# Patient Record
Sex: Female | Born: 1980 | Race: Black or African American | Hispanic: No | Marital: Single | Smoking: Current some day smoker
Health system: Southern US, Community
[De-identification: ages and names within clinical notes are randomized; demographics above are authoritative.]

## PROBLEM LIST (undated history)

## (undated) DIAGNOSIS — F419 Anxiety disorder, unspecified: Secondary | ICD-10-CM

## (undated) DIAGNOSIS — D649 Anemia, unspecified: Secondary | ICD-10-CM

## (undated) DIAGNOSIS — F4310 Post-traumatic stress disorder, unspecified: Secondary | ICD-10-CM

## (undated) HISTORY — PX: CARTILAGE SURGERY: SHX1303

## (undated) HISTORY — PX: LIGAMENT REPAIR: SHX5444

## (undated) HISTORY — PX: CHEST SURGERY: SHX595

---

## 2001-11-27 ENCOUNTER — Emergency Department (HOSPITAL_COMMUNITY): Admission: EM | Admit: 2001-11-27 | Discharge: 2001-11-27 | Payer: Self-pay

## 2004-12-15 ENCOUNTER — Inpatient Hospital Stay (HOSPITAL_COMMUNITY): Admission: AD | Admit: 2004-12-15 | Discharge: 2004-12-15 | Payer: Self-pay | Admitting: Obstetrics & Gynecology

## 2005-02-11 ENCOUNTER — Inpatient Hospital Stay: Admission: AD | Admit: 2005-02-11 | Discharge: 2005-02-11 | Payer: Self-pay | Admitting: Obstetrics and Gynecology

## 2008-02-13 ENCOUNTER — Emergency Department (HOSPITAL_COMMUNITY): Admission: EM | Admit: 2008-02-13 | Discharge: 2008-02-13 | Payer: Self-pay | Admitting: Emergency Medicine

## 2008-08-27 ENCOUNTER — Emergency Department (HOSPITAL_COMMUNITY): Admission: EM | Admit: 2008-08-27 | Discharge: 2008-08-27 | Payer: Self-pay | Admitting: Emergency Medicine

## 2009-06-25 ENCOUNTER — Emergency Department (HOSPITAL_COMMUNITY): Admission: EM | Admit: 2009-06-25 | Discharge: 2009-06-25 | Payer: Self-pay | Admitting: Emergency Medicine

## 2009-11-07 ENCOUNTER — Emergency Department (HOSPITAL_COMMUNITY): Admission: EM | Admit: 2009-11-07 | Discharge: 2009-11-07 | Payer: Self-pay | Admitting: Emergency Medicine

## 2010-07-17 ENCOUNTER — Emergency Department (HOSPITAL_COMMUNITY): Admission: EM | Admit: 2010-07-17 | Discharge: 2010-07-17 | Payer: Self-pay | Admitting: Emergency Medicine

## 2010-08-31 ENCOUNTER — Emergency Department (HOSPITAL_COMMUNITY): Admission: EM | Admit: 2010-08-31 | Discharge: 2010-08-31 | Payer: Self-pay | Admitting: Emergency Medicine

## 2011-02-05 ENCOUNTER — Emergency Department (HOSPITAL_COMMUNITY)
Admission: EM | Admit: 2011-02-05 | Discharge: 2011-02-05 | Disposition: A | Discharge status: Home / Self Care | Payer: Self-pay | Attending: Emergency Medicine | Admitting: Emergency Medicine

## 2011-02-05 DIAGNOSIS — L089 Local infection of the skin and subcutaneous tissue, unspecified: Secondary | ICD-10-CM | POA: Insufficient documentation

## 2011-09-09 LAB — URINE MICROSCOPIC-ADD ON

## 2011-09-09 LAB — URINALYSIS, ROUTINE W REFLEX MICROSCOPIC
Bilirubin Urine: NEGATIVE
Glucose, UA: NEGATIVE
Hgb urine dipstick: NEGATIVE
Urobilinogen, UA: 1

## 2013-03-07 ENCOUNTER — Emergency Department (HOSPITAL_COMMUNITY)
Admission: EM | Admit: 2013-03-07 | Discharge: 2013-03-07 | Disposition: A | Discharge status: Home / Self Care | Payer: Medicaid Other | Attending: Emergency Medicine | Admitting: Emergency Medicine

## 2013-03-07 ENCOUNTER — Encounter (HOSPITAL_COMMUNITY): Payer: Self-pay | Admitting: Emergency Medicine

## 2013-03-07 DIAGNOSIS — N938 Other specified abnormal uterine and vaginal bleeding: Secondary | ICD-10-CM | POA: Insufficient documentation

## 2013-03-07 DIAGNOSIS — R112 Nausea with vomiting, unspecified: Secondary | ICD-10-CM | POA: Insufficient documentation

## 2013-03-07 DIAGNOSIS — F17200 Nicotine dependence, unspecified, uncomplicated: Secondary | ICD-10-CM | POA: Insufficient documentation

## 2013-03-07 DIAGNOSIS — Z862 Personal history of diseases of the blood and blood-forming organs and certain disorders involving the immune mechanism: Secondary | ICD-10-CM | POA: Insufficient documentation

## 2013-03-07 DIAGNOSIS — R197 Diarrhea, unspecified: Secondary | ICD-10-CM | POA: Insufficient documentation

## 2013-03-07 DIAGNOSIS — Z3202 Encounter for pregnancy test, result negative: Secondary | ICD-10-CM | POA: Insufficient documentation

## 2013-03-07 DIAGNOSIS — N949 Unspecified condition associated with female genital organs and menstrual cycle: Secondary | ICD-10-CM | POA: Insufficient documentation

## 2013-03-07 HISTORY — DX: Anemia, unspecified: D64.9

## 2013-03-07 LAB — COMPREHENSIVE METABOLIC PANEL
ALT: 15 U/L (ref 0–35)
AST: 22 U/L (ref 0–37)
Albumin: 3.3 g/dL — ABNORMAL LOW (ref 3.5–5.2)
Alkaline Phosphatase: 51 U/L (ref 39–117)
BUN: 18 mg/dL (ref 6–23)
CO2: 25 mEq/L (ref 19–32)
Calcium: 9.1 mg/dL (ref 8.4–10.5)
Chloride: 105 mEq/L (ref 96–112)
Creatinine, Ser: 0.83 mg/dL (ref 0.50–1.10)
GFR calc Af Amer: >90 mL/min (ref >90)
GFR calc non Af Amer: >90 mL/min (ref >90)
Glucose, Bld: 96 mg/dL (ref 70–99)
Potassium: 3.7 mEq/L (ref 3.5–5.1)
Sodium: 139 mEq/L (ref 135–145)
Total Bilirubin: 0.1 mg/dL — ABNORMAL LOW (ref 0.3–1.2)
Total Protein: 6.9 g/dL (ref 6.0–8.3)

## 2013-03-07 LAB — CBC WITH DIFFERENTIAL/PLATELET
HCT: 32.1 % — ABNORMAL LOW (ref 36.0–46.0)
Hemoglobin: 11.2 g/dL — ABNORMAL LOW (ref 12.0–15.0)
Lymphs Abs: 2.2 10*3/uL (ref 0.7–4.0)
MCHC: 34.9 g/dL (ref 30.0–36.0)
Monocytes Absolute: 0.5 10*3/uL (ref 0.1–1.0)
Monocytes Relative: 7 % (ref 3–12)
Platelets: 199 10*3/uL (ref 150–400)

## 2013-03-07 LAB — URINALYSIS, MICROSCOPIC ONLY
Bilirubin Urine: NEGATIVE
Glucose, UA: NEGATIVE mg/dL
Ketones, ur: 15 mg/dL — AB
Leukocytes, UA: NEGATIVE
Nitrite: NEGATIVE
Protein, ur: NEGATIVE mg/dL
Specific Gravity, Urine: 1.03 (ref 1.005–1.030)
Urobilinogen, UA: 1 mg/dL (ref 0.0–1.0)
pH: 8 (ref 5.0–8.0)

## 2013-03-07 LAB — LIPASE, BLOOD: Lipase: 35 U/L (ref 11–59)

## 2013-03-07 MED ORDER — ONDANSETRON 4 MG PO TBDP
8.0000 mg | ORAL_TABLET | Freq: Once | ORAL | Status: AC
  Administered 2013-03-07: 8 mg via ORAL
  Filled 2013-03-07: qty 2

## 2013-03-07 MED ORDER — ONDANSETRON 8 MG PO TBDP: 8.0000 mg | ORAL_TABLET | Freq: Three times a day (TID) | ORAL | Status: DC | PRN

## 2013-03-07 MED ORDER — OXYCODONE-ACETAMINOPHEN 5-325 MG PO TABS: 1.0000 | ORAL_TABLET | ORAL | Status: DC | PRN

## 2013-03-07 MED ORDER — OXYCODONE-ACETAMINOPHEN 5-325 MG PO TABS
2.0000 | ORAL_TABLET | Freq: Once | ORAL | Status: AC
  Administered 2013-03-07: 2 via ORAL
  Filled 2013-03-07: qty 2

## 2013-03-07 MED ORDER — IBUPROFEN 600 MG PO TABS: 600.0000 mg | ORAL_TABLET | Freq: Three times a day (TID) | ORAL | Status: DC | PRN

## 2013-03-07 NOTE — ED Notes (Signed)
Pt knows we need a urine sample 

## 2013-03-07 NOTE — ED Notes (Signed)
Pt reports for the past 3 months she has been getting sick with the onset of her menstrual periods. Pt reports usually she deals with heavy bleeding and pain but the past three months she has had N/V/D that occurs 24-48 hours of her menstrual.

## 2013-03-07 NOTE — ED Provider Notes (Signed)
History     CSN: [id number]  Arrival date & time 03/07/13  1751   First MD Initiated Contact with Patient 03/07/13 1825      Chief Complaint  Patient presents with  . Nausea  . Diarrhea    HPI Patient reports ongoing discomfort in her lower abdomen over the past 3 months only associated with menstrual cycles.  Her menstrual cycle began today she developed mild vaginal bleeding consistent with her typical menstrual cycle.  She also developed nausea vomiting and diarrhea today.  She states this is the third straight month when the symptoms this occurred.  She has not seen her OB/GYN.  At this time she is without significant nausea.  She still reports some lower abdominal discomfort.  No urinary symptoms.  No vaginal discharge.  No pain with intercourse.  No clotting of her vaginal bleeding.  No weakness no chest pain or shortness of breath.   Past Medical History  Diagnosis Date  . Anemia     Past Surgical History  Procedure Laterality Date  . Chest surgery    . Ligament repair    . Cartilage surgery      No family history on file.  History  Substance Use Topics  . Smoking status: Current Every Day Smoker  . Smokeless tobacco: Not on file  . Alcohol Use: Yes    OB History   Grav Para Term Preterm Abortions TAB SAB Ect Mult Living                  Review of Systems  All other systems reviewed and are negative.    Allergies  Review of patient's allergies indicates no known allergies.  Home Medications   Current Outpatient Rx  Name  Route  Sig  Dispense  Refill  . acetaminophen (TYLENOL) 500 MG tablet   Oral   Take 1,000 mg by mouth every 6 (six) hours as needed for pain.         Marland Kitchen ibuprofen (ADVIL,MOTRIN) 600 MG tablet   Oral   Take 1 tablet (600 mg total) by mouth every 8 (eight) hours as needed for pain.   15 tablet   0   . ondansetron (ZOFRAN ODT) 8 MG disintegrating tablet   Oral   Take 1 tablet (8 mg total) by mouth every 8 (eight) hours as  needed for nausea.   10 tablet   0   . oxyCODONE-acetaminophen (PERCOCET/ROXICET) 5-325 MG per tablet   Oral   Take 1 tablet by mouth every 4 (four) hours as needed for pain.   15 tablet   0     BP 122/80  Pulse 89  Temp(Src) 98.2 F (36.8 C) (Oral)  Resp 18  SpO2 95%  LMP 03/06/2013  Physical Exam  Nursing note and vitals reviewed. Constitutional: She is oriented to person, place, and time. She appears well-developed and well-nourished. No distress.  HENT:  Head: Normocephalic and atraumatic.  Eyes: EOM are normal.  Neck: Normal range of motion.  Cardiovascular: Normal rate, regular rhythm and normal heart sounds.   Pulmonary/Chest: Effort normal and breath sounds normal.  Abdominal: Soft. She exhibits no distension. There is no tenderness. There is no rebound and no guarding.  Musculoskeletal: Normal range of motion.  Neurological: She is alert and oriented to person, place, and time.  Skin: Skin is warm and dry.  Psychiatric: She has a normal mood and affect. Judgment normal.    ED Course  Procedures (including critical care time)  Labs Reviewed  CBC WITH DIFFERENTIAL - Abnormal; Notable for the following:    RBC 3.61 (*)    Hemoglobin 11.2 (*)    HCT 32.1 (*)    All other components within normal limits  COMPREHENSIVE METABOLIC PANEL - Abnormal; Notable for the following:    Albumin 3.3 (*)    Total Bilirubin 0.1 (*)    All other components within normal limits  LIPASE, BLOOD  URINALYSIS, MICROSCOPIC ONLY  HCG, SERUM, QUALITATIVE  POCT PREGNANCY, URINE   No results found.   1. Nausea vomiting and diarrhea       MDM  Being that this is associated with her menses this may represent endometriosis.  The patient will need followup with her OB/GYN.  Urine pregnancy test is negative and her laboratory studies are normal.  She has no vaginal discharge at this time.  I will defer pelvic examination.  Doubt ovarian torsion.  Hydrated appearing.  Vital signs  normal.  Home with pain medicine and nausea medicine.  Home with instructions to followup with OB/GYN         Lyanne Co, MD 03/07/13 [phone]

## 2013-04-20 ENCOUNTER — Emergency Department (HOSPITAL_COMMUNITY): Payer: Medicaid Other

## 2013-04-20 ENCOUNTER — Emergency Department (HOSPITAL_COMMUNITY)
Admission: EM | Admit: 2013-04-20 | Discharge: 2013-04-20 | Disposition: A | Discharge status: Home / Self Care | Payer: Medicaid Other | Attending: Emergency Medicine | Admitting: Emergency Medicine

## 2013-04-20 ENCOUNTER — Encounter (HOSPITAL_COMMUNITY): Payer: Self-pay | Admitting: Emergency Medicine

## 2013-04-20 DIAGNOSIS — Z862 Personal history of diseases of the blood and blood-forming organs and certain disorders involving the immune mechanism: Secondary | ICD-10-CM | POA: Insufficient documentation

## 2013-04-20 DIAGNOSIS — X500XXA Overexertion from strenuous movement or load, initial encounter: Secondary | ICD-10-CM | POA: Insufficient documentation

## 2013-04-20 DIAGNOSIS — S99919A Unspecified injury of unspecified ankle, initial encounter: Secondary | ICD-10-CM | POA: Insufficient documentation

## 2013-04-20 DIAGNOSIS — Y939 Activity, unspecified: Secondary | ICD-10-CM | POA: Insufficient documentation

## 2013-04-20 DIAGNOSIS — M25561 Pain in right knee: Secondary | ICD-10-CM

## 2013-04-20 DIAGNOSIS — Y929 Unspecified place or not applicable: Secondary | ICD-10-CM | POA: Insufficient documentation

## 2013-04-20 DIAGNOSIS — F17200 Nicotine dependence, unspecified, uncomplicated: Secondary | ICD-10-CM | POA: Insufficient documentation

## 2013-04-20 DIAGNOSIS — S8990XA Unspecified injury of unspecified lower leg, initial encounter: Secondary | ICD-10-CM | POA: Insufficient documentation

## 2013-04-20 DIAGNOSIS — Z9889 Other specified postprocedural states: Secondary | ICD-10-CM | POA: Insufficient documentation

## 2013-04-20 MED ORDER — KETOROLAC TROMETHAMINE 60 MG/2ML IM SOLN
60.0000 mg | Freq: Once | INTRAMUSCULAR | Status: AC
  Administered 2013-04-20: 60 mg via INTRAMUSCULAR
  Filled 2013-04-20: qty 2

## 2013-04-20 MED ORDER — NAPROXEN 500 MG PO TABS: 500.0000 mg | ORAL_TABLET | Freq: Two times a day (BID) | ORAL | Status: DC

## 2013-04-20 MED ORDER — HYDROCODONE-ACETAMINOPHEN 5-325 MG PO TABS: ORAL_TABLET | ORAL | Status: DC

## 2013-04-20 NOTE — ED Provider Notes (Signed)
Medical screening examination/treatment/procedure(s) were performed by non-physician practitioner and as supervising physician I was immediately available for consultation/collaboration.  Flint Melter, MD 04/20/13 [phone]

## 2013-04-20 NOTE — ED Notes (Signed)
Pt states she heard her right knee "pop" two days ago. Pt denies falling. Pt states she heard her right leg "snap" yesterday. Pt states she can bare weight slightly on her right leg. Pt states she has pain at rest, anterior ly and posteriorly.

## 2013-04-20 NOTE — ED Provider Notes (Signed)
History     CSN: [id number]  Arrival date & time 04/20/13  1033   First MD Initiated Contact with Patient 04/20/13 1059      Chief Complaint  Patient presents with  . Knee Pain    (Consider location/radiation/quality/duration/timing/severity/associated sxs/prior treatment) HPI Comments: Patient with remote history of anterior cruciate ligament repair and right knee - presents with 2 days of right knee pain. Patient states that she heard and felt a pop in her right knee at the onset and began yesterday when she was bending. Pain is severe. No treatments prior to arrival. She is unable to walk due to the pain. She denies swelling of her lower extremity. No fever. Course is constant. Movement makes pain worse.  Patient is a 32 y.o. female presenting with knee pain. The history is provided by the patient.  Knee Pain Associated symptoms: no back pain and no neck pain     Past Medical History  Diagnosis Date  . Anemia     Past Surgical History  Procedure Laterality Date  . Chest surgery    . Ligament repair    . Cartilage surgery      No family history on file.  History  Substance Use Topics  . Smoking status: Current Every Day Smoker  . Smokeless tobacco: Not on file  . Alcohol Use: Yes    OB History   Grav Para Term Preterm Abortions TAB SAB Ect Mult Living                  Review of Systems  Constitutional: Positive for activity change.  HENT: Negative for neck pain.   Musculoskeletal: Positive for joint swelling and arthralgias. Negative for back pain.  Skin: Negative for wound.  Neurological: Negative for weakness and numbness.    Allergies  Review of patient's allergies indicates no known allergies.  Home Medications   Current Outpatient Rx  Name  Route  Sig  Dispense  Refill  . HYDROcodone-acetaminophen (NORCO/VICODIN) 5-325 MG per tablet      Take 1-2 tablets every 6 hours as needed for severe pain   12 tablet   0   . naproxen (NAPROSYN) 500 MG  tablet   Oral   Take 1 tablet (500 mg total) by mouth 2 (two) times daily.   20 tablet   0     BP 122/67  Pulse 87  Temp(Src) 98.4 F (36.9 C) (Oral)  Resp 20  Ht 5' 5.5" (1.664 m)  Wt 125 lb (56.7 kg)  BMI 20.48 kg/m2  SpO2 100%  LMP 04/01/2013  Physical Exam  Nursing note and vitals reviewed. Constitutional: She appears well-developed and well-nourished.  HENT:  Head: Normocephalic and atraumatic.  Eyes: Pupils are equal, round, and reactive to light.  Neck: Normal range of motion. Neck supple.  Cardiovascular: Exam reveals no decreased pulses.   Musculoskeletal: She exhibits tenderness. She exhibits no edema.       Right knee: She exhibits swelling and effusion (mild). She exhibits normal range of motion. Tenderness found. Medial joint line and lateral joint line tenderness noted.       Right ankle: Normal. She exhibits normal pulse.       Right upper leg: She exhibits no tenderness.       Right lower leg: She exhibits no tenderness (No evidence of compartment syndrome) and no swelling.       Legs:      Right foot: She exhibits normal range of motion and normal capillary  refill.  Neurological: She is alert. No sensory deficit.  Motor, sensation, and vascular distal to the injury is fully intact.   Skin: Skin is warm and dry.  Psychiatric: She has a normal mood and affect.    ED Course  Procedures (including critical care time)  Labs Reviewed - No data to display Dg Knee Complete 4 Views Right  04/20/2013  *RADIOLOGY REPORT*  Clinical Data: Knee pain  RIGHT KNEE - COMPLETE 4+ VIEW  Comparison: None.  Findings: Four views of the right knee submitted.  No acute fracture or subluxation.  Post ACL repair changes distal femur and proximal tibia.  No joint effusion.  IMPRESSION: No acute fracture or subluxation.  Postsurgical changes.   Original Report Authenticated By: Natasha Mead, M.D.      1. Knee pain, right     11:49 AM Patient seen and examined. X-ray results  reviewed. Patient informed of results. Medications ordered.   Vital signs reviewed and are as follows: Filed Vitals:   04/20/13 1038  BP: 122/67  Pulse: 87  Temp: 98.4 F (36.9 C)  Resp: 20   12:33 PM Pain controlled after Toradol. Exam is unchanged. No signs of compartment syndrome. Orthopedic referral given. Knee immobilizer given by orthopedic technician. Patient has crutches.  Patient counseled on use of narcotic pain medications. Counseled not to combine these medications with others containing tylenol. Urged not to drink alcohol, drive, or perform any other activities that requires focus while taking these medications. The patient verbalizes understanding and agrees with the plan.   MDM  Knee pain, lower extremity is neurovascularly intact. X-rays negative. Given history of surgery, patient should follow up with orthopedic physician, referrals given. Otherwise, rice protocol, NSAIDs, and conservative management indicated.       Renne Crigler, PA-C 04/20/13 1234

## 2013-04-29 ENCOUNTER — Emergency Department (HOSPITAL_COMMUNITY)
Admission: EM | Admit: 2013-04-29 | Discharge: 2013-04-29 | Disposition: A | Discharge status: Home / Self Care | Payer: No Typology Code available for payment source | Attending: Emergency Medicine | Admitting: Emergency Medicine

## 2013-04-29 ENCOUNTER — Encounter (HOSPITAL_COMMUNITY): Payer: Self-pay | Admitting: Adult Health

## 2013-04-29 ENCOUNTER — Emergency Department (HOSPITAL_COMMUNITY): Payer: No Typology Code available for payment source

## 2013-04-29 DIAGNOSIS — M25561 Pain in right knee: Secondary | ICD-10-CM

## 2013-04-29 DIAGNOSIS — M25511 Pain in right shoulder: Secondary | ICD-10-CM

## 2013-04-29 DIAGNOSIS — S99929A Unspecified injury of unspecified foot, initial encounter: Secondary | ICD-10-CM | POA: Insufficient documentation

## 2013-04-29 DIAGNOSIS — Y92410 Unspecified street and highway as the place of occurrence of the external cause: Secondary | ICD-10-CM | POA: Insufficient documentation

## 2013-04-29 DIAGNOSIS — Y9389 Activity, other specified: Secondary | ICD-10-CM | POA: Insufficient documentation

## 2013-04-29 DIAGNOSIS — S8990XA Unspecified injury of unspecified lower leg, initial encounter: Secondary | ICD-10-CM | POA: Insufficient documentation

## 2013-04-29 DIAGNOSIS — S46909A Unspecified injury of unspecified muscle, fascia and tendon at shoulder and upper arm level, unspecified arm, initial encounter: Secondary | ICD-10-CM | POA: Insufficient documentation

## 2013-04-29 DIAGNOSIS — IMO0002 Reserved for concepts with insufficient information to code with codable children: Secondary | ICD-10-CM | POA: Insufficient documentation

## 2013-04-29 DIAGNOSIS — F17200 Nicotine dependence, unspecified, uncomplicated: Secondary | ICD-10-CM | POA: Insufficient documentation

## 2013-04-29 DIAGNOSIS — S4980XA Other specified injuries of shoulder and upper arm, unspecified arm, initial encounter: Secondary | ICD-10-CM | POA: Insufficient documentation

## 2013-04-29 DIAGNOSIS — Z862 Personal history of diseases of the blood and blood-forming organs and certain disorders involving the immune mechanism: Secondary | ICD-10-CM | POA: Insufficient documentation

## 2013-04-29 MED ORDER — METHOCARBAMOL 500 MG PO TABS: 500.0000 mg | ORAL_TABLET | Freq: Two times a day (BID) | ORAL | Status: DC

## 2013-04-29 MED ORDER — HYDROCODONE-ACETAMINOPHEN 5-325 MG PO TABS: 2.0000 | ORAL_TABLET | ORAL | Status: DC | PRN
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
## 2013-04-30 NOTE — ED Provider Notes (Signed)
Medical screening examination/treatment/procedure(s) were performed by non-physician practitioner and as supervising physician I was immediately available for consultation/collaboration. Devoria Albe, MD, FACEP   Ward Givens, MD 04/30/13 [phone]

## 2013-10-08 ENCOUNTER — Emergency Department (HOSPITAL_COMMUNITY)
Admission: EM | Admit: 2013-10-08 | Discharge: 2013-10-08 | Disposition: A | Discharge status: Home / Self Care | Payer: Medicaid Other | Attending: Emergency Medicine | Admitting: Emergency Medicine

## 2013-10-08 ENCOUNTER — Encounter (HOSPITAL_COMMUNITY): Payer: Self-pay | Admitting: Emergency Medicine

## 2013-10-08 ENCOUNTER — Emergency Department (HOSPITAL_COMMUNITY): Payer: Medicaid Other

## 2013-10-08 DIAGNOSIS — F17200 Nicotine dependence, unspecified, uncomplicated: Secondary | ICD-10-CM | POA: Insufficient documentation

## 2013-10-08 DIAGNOSIS — Z862 Personal history of diseases of the blood and blood-forming organs and certain disorders involving the immune mechanism: Secondary | ICD-10-CM | POA: Insufficient documentation

## 2013-10-08 DIAGNOSIS — IMO0001 Reserved for inherently not codable concepts without codable children: Secondary | ICD-10-CM | POA: Insufficient documentation

## 2013-10-08 DIAGNOSIS — M79601 Pain in right arm: Secondary | ICD-10-CM

## 2013-10-08 DIAGNOSIS — R209 Unspecified disturbances of skin sensation: Secondary | ICD-10-CM | POA: Insufficient documentation

## 2013-10-08 DIAGNOSIS — M79609 Pain in unspecified limb: Secondary | ICD-10-CM | POA: Insufficient documentation

## 2013-10-08 DIAGNOSIS — M542 Cervicalgia: Secondary | ICD-10-CM | POA: Insufficient documentation

## 2013-10-08 MED ORDER — MELOXICAM 7.5 MG PO TABS: 7.5000 mg | ORAL_TABLET | Freq: Every day | ORAL | Status: DC

## 2013-10-08 MED ORDER — HYDROMORPHONE HCL PF 1 MG/ML IJ SOLN
1.0000 mg | Freq: Once | INTRAMUSCULAR | Status: AC
  Administered 2013-10-08: 1 mg via INTRAMUSCULAR
  Filled 2013-10-08: qty 1

## 2013-10-08 MED ORDER — DIAZEPAM 5 MG/ML IJ SOLN
5.0000 mg | Freq: Once | INTRAMUSCULAR | Status: AC
  Administered 2013-10-08: 5 mg via INTRAMUSCULAR
  Filled 2013-10-08: qty 2

## 2013-10-08 NOTE — ED Notes (Signed)
Pt returned from radiology.
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
## 2013-10-08 NOTE — ED Notes (Signed)
Pt reports right arm pain which woke her up this morning. Pt appears in sever pain with guarding of the extremity.

## 2013-10-11 NOTE — ED Provider Notes (Signed)
Medical screening examination/treatment/procedure(s) were performed by non-physician practitioner and as supervising physician I was immediately available for consultation/collaboration.  EKG Interpretation     Ventricular Rate:  85 PR Interval:  163 QRS Duration: 66 QT Interval:  375 QTC Calculation: 446 R Axis:   84 Text Interpretation:  Sinus rhythm ED PHYSICIAN INTERPRETATION AVAILABLE IN [facility] Beather Arbour, MD 10/11/13 1500

## 2014-03-25 ENCOUNTER — Emergency Department (HOSPITAL_COMMUNITY)
Admission: EM | Admit: 2014-03-25 | Discharge: 2014-03-25 | Disposition: A | Discharge status: Home / Self Care | Payer: Medicaid Other | Attending: Emergency Medicine | Admitting: Emergency Medicine

## 2014-03-25 ENCOUNTER — Encounter (HOSPITAL_COMMUNITY): Payer: Self-pay | Admitting: Emergency Medicine

## 2014-03-25 DIAGNOSIS — Y939 Activity, unspecified: Secondary | ICD-10-CM | POA: Insufficient documentation

## 2014-03-25 DIAGNOSIS — F17200 Nicotine dependence, unspecified, uncomplicated: Secondary | ICD-10-CM | POA: Insufficient documentation

## 2014-03-25 DIAGNOSIS — Y929 Unspecified place or not applicable: Secondary | ICD-10-CM | POA: Insufficient documentation

## 2014-03-25 DIAGNOSIS — T63481A Toxic effect of venom of other arthropod, accidental (unintentional), initial encounter: Secondary | ICD-10-CM | POA: Insufficient documentation

## 2014-03-25 DIAGNOSIS — Z862 Personal history of diseases of the blood and blood-forming organs and certain disorders involving the immune mechanism: Secondary | ICD-10-CM | POA: Insufficient documentation

## 2014-03-25 DIAGNOSIS — T63441A Toxic effect of venom of bees, accidental (unintentional), initial encounter: Secondary | ICD-10-CM

## 2014-03-25 DIAGNOSIS — T6391XA Toxic effect of contact with unspecified venomous animal, accidental (unintentional), initial encounter: Secondary | ICD-10-CM | POA: Insufficient documentation

## 2014-03-25 MED ORDER — HYDROCODONE-ACETAMINOPHEN 5-325 MG PO TABS: 2.0000 | ORAL_TABLET | ORAL | Status: DC | PRN

## 2014-03-25 MED ORDER — OXYCODONE-ACETAMINOPHEN 5-325 MG PO TABS
2.0000 | ORAL_TABLET | Freq: Once | ORAL | Status: AC
  Administered 2014-03-25: 2 via ORAL
  Filled 2014-03-25: qty 2

## 2014-03-25 MED ORDER — KETOROLAC TROMETHAMINE 60 MG/2ML IM SOLN
60.0000 mg | Freq: Once | INTRAMUSCULAR | Status: AC
  Administered 2014-03-25: 60 mg via INTRAMUSCULAR
  Filled 2014-03-25: qty 2

## 2014-03-25 MED ORDER — DIPHENHYDRAMINE HCL 25 MG PO CAPS
25.0000 mg | ORAL_CAPSULE | Freq: Once | ORAL | Status: AC
  Administered 2014-03-25: 25 mg via ORAL
  Filled 2014-03-25: qty 1

## 2014-03-25 MED ORDER — PREDNISONE 20 MG PO TABS
40.0000 mg | ORAL_TABLET | Freq: Once | ORAL | Status: AC
  Administered 2014-03-25: 40 mg via ORAL
  Filled 2014-03-25: qty 2

## 2014-03-25 MED ORDER — SULFAMETHOXAZOLE-TRIMETHOPRIM 800-160 MG PO TABS: 1.0000 | ORAL_TABLET | Freq: Two times a day (BID) | ORAL | Status: DC

## 2014-03-25 MED ORDER — ACETAMINOPHEN 325 MG PO TABS
650.0000 mg | ORAL_TABLET | Freq: Once | ORAL | Status: AC
  Administered 2014-03-25: 650 mg via ORAL
  Filled 2014-03-25: qty 2

## 2014-03-25 NOTE — Discharge Instructions (Signed)
Take Bactrim as directed until gone. Take Vicodin as needed for pain. Refer to attached documents for more information. Return to the ED with worsening or concerning symptoms.

## 2014-03-25 NOTE — ED Provider Notes (Signed)
Medical screening examination/treatment/procedure(s) were performed by non-physician practitioner and as supervising physician I was immediately available for consultation/collaboration.   EKG Interpretation None        Einer Meals, MD 03/25/14 2143 

## 2014-03-25 NOTE — ED Provider Notes (Signed)
CSN: [id number]     Arrival date & time 03/25/14  1538 History  This chart was scribed for non-physician practitioner, Emilia BeckKaitlyn Jeffie Widdowson, PA-C, working with Gwyneth SproutWhitney Plunkett, MD by Charline BillsEssence Howell, ED Scribe. This patient was seen in room WTR5/WTR5 and the patient's care was started at 4:08 PM.    Chief Complaint  Patient presents with  . Allergic Reaction    Patient is a 33 y.o. female presenting with animal bite. The history is provided by the patient. No language interpreter was used.  Animal Bite Contact animal:  Insect Location:  Head/neck Head/neck injury location:  Neck Pain details:    Severity:  Severe   Timing:  Constant   Progression:  Worsening Relieved by:  None tried HPI Comments: Kendra Galloahesha Sundt is a 33 y.o. female who presents to the Emergency Department complaining of allergic reaction. Pt reports being stung by an unknown insect to R side of her neck. She reports associated severe neck pain and swelling that she describes as intermittent.    Past Medical History  Diagnosis Date  . Anemia    Past Surgical History  Procedure Laterality Date  . Chest surgery    . Ligament repair    . Cartilage surgery     No family history on file. History  Substance Use Topics  . Smoking status: Current Every Day Smoker  . Smokeless tobacco: Not on file  . Alcohol Use: Yes   OB History   Grav Para Term Preterm Abortions TAB SAB Ect Mult Living                 Review of Systems  All other systems reviewed and are negative.   Allergies  Review of patient's allergies indicates no known allergies.  Home Medications   Prior to Admission medications   Not on File   Triage Vitals: Pulse 90  Temp(Src) 98.3 F (36.8 C) (Oral)  Resp 20  Ht 5' 5.5" (1.664 m)  Wt 122 lb (55.339 kg)  BMI 19.99 kg/m2  SpO2 100% Physical Exam  Nursing note and vitals reviewed. Constitutional: She is oriented to person, place, and time. She appears well-developed and well-nourished. No  distress.  HENT:  Head: Normocephalic and atraumatic.  Small macular lesion of R lateral neck with surrounding erythema and tenderness to palpation No open wound   Eyes: EOM are normal.  Neck: Neck supple.  Cardiovascular: Normal rate.   Pulmonary/Chest: Effort normal. No respiratory distress.  Musculoskeletal: Normal range of motion.  Neurological: She is alert and oriented to person, place, and time.  Skin: Skin is warm and dry.  Psychiatric: She has a normal mood and affect. Her behavior is normal.    ED Course  Procedures (including critical care time) DIAGNOSTIC STUDIES: Oxygen Saturation is 100% on RA, normal by my interpretation.    COORDINATION OF CARE: 4:11 PM-Discussed treatment plan which includes Benadryl, pain medication and antibiotics with pt at bedside and pt agreed to plan.   Labs Review Labs Reviewed - No data to display  Imaging Review No results found.   EKG Interpretation None      MDM   Final diagnoses:  Bee sting reaction    4:44 PM Patient strung by a bee on her left lateral neck. Patient given tylenol, benadryl, and prednisone. Patient will have PO percocet and IM toradol due to worsening pain. Vitals stable and patient afebrile.   5:35 PM Patient feeling better and will be discharged with  Bactrim for infection prevention and  Vicodin for pain. Vitals stable and patient afebrile.   I personally performed the services described in this documentation, which was scribed in my presence. The recorded information has been reviewed and is accurate.    Emilia BeckKaitlyn Marianne Golightly, PA-C 03/25/14 1736

## 2014-03-25 NOTE — ED Notes (Signed)
Pt reports being stung by bee to R neck, mild swelling noted to site and pt states it is very painful. Pt reports "feels like I'm breathing a little funny, but I always have breathing trouble when in pain so I think it's just related to the pain." Pt states she doesn't feel swelling in throat. Airway patent, NAD noted.
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
## 2014-07-02 ENCOUNTER — Inpatient Hospital Stay (HOSPITAL_COMMUNITY)
Admission: AD | Admit: 2014-07-02 | Discharge: 2014-07-07 | DRG: 882 | Disposition: A | Discharge status: Home / Self Care | Payer: No Typology Code available for payment source | Source: Intra-hospital | Attending: Psychiatry | Admitting: Psychiatry

## 2014-07-02 ENCOUNTER — Emergency Department (HOSPITAL_COMMUNITY)
Admission: EM | Admit: 2014-07-02 | Discharge: 2014-07-02 | Disposition: A | Discharge status: Other Acute Inpatient Hospital | Payer: No Typology Code available for payment source | Attending: Dermatology | Admitting: Dermatology

## 2014-07-02 ENCOUNTER — Encounter (HOSPITAL_COMMUNITY): Payer: Self-pay | Admitting: Emergency Medicine

## 2014-07-02 DIAGNOSIS — G4700 Insomnia, unspecified: Secondary | ICD-10-CM | POA: Diagnosis present

## 2014-07-02 DIAGNOSIS — F22 Delusional disorders: Secondary | ICD-10-CM

## 2014-07-02 DIAGNOSIS — F4310 Post-traumatic stress disorder, unspecified: Secondary | ICD-10-CM | POA: Diagnosis present

## 2014-07-02 DIAGNOSIS — F411 Generalized anxiety disorder: Secondary | ICD-10-CM | POA: Diagnosis present

## 2014-07-02 DIAGNOSIS — F1210 Cannabis abuse, uncomplicated: Secondary | ICD-10-CM | POA: Diagnosis present

## 2014-07-02 DIAGNOSIS — F600 Paranoid personality disorder: Secondary | ICD-10-CM | POA: Insufficient documentation

## 2014-07-02 DIAGNOSIS — F17200 Nicotine dependence, unspecified, uncomplicated: Secondary | ICD-10-CM | POA: Insufficient documentation

## 2014-07-02 DIAGNOSIS — Z862 Personal history of diseases of the blood and blood-forming organs and certain disorders involving the immune mechanism: Secondary | ICD-10-CM | POA: Insufficient documentation

## 2014-07-02 DIAGNOSIS — F332 Major depressive disorder, recurrent severe without psychotic features: Secondary | ICD-10-CM | POA: Diagnosis present

## 2014-07-02 DIAGNOSIS — Z3202 Encounter for pregnancy test, result negative: Secondary | ICD-10-CM | POA: Insufficient documentation

## 2014-07-02 DIAGNOSIS — Z609 Problem related to social environment, unspecified: Secondary | ICD-10-CM

## 2014-07-02 DIAGNOSIS — R451 Restlessness and agitation: Secondary | ICD-10-CM

## 2014-07-02 LAB — BASIC METABOLIC PANEL
ANION GAP: 13 (ref 5–15)
BUN: 15 mg/dL (ref 6–23)
CHLORIDE: 104 meq/L (ref 96–112)
CO2: 20 meq/L (ref 19–32)
CREATININE: 0.71 mg/dL (ref 0.50–1.10)
Calcium: 9.1 mg/dL (ref 8.4–10.5)
GFR calc non Af Amer: >90 mL/min (ref >90)
Glucose, Bld: 88 mg/dL (ref 70–99)
POTASSIUM: 3.4 meq/L — AB (ref 3.7–5.3)
Sodium: 137 mEq/L (ref 137–147)

## 2014-07-02 LAB — URINALYSIS, ROUTINE W REFLEX MICROSCOPIC
Bilirubin Urine: NEGATIVE
GLUCOSE, UA: NEGATIVE mg/dL
HGB URINE DIPSTICK: NEGATIVE
Ketones, ur: 15 mg/dL — AB
LEUKOCYTES UA: NEGATIVE
Nitrite: NEGATIVE
PROTEIN: NEGATIVE mg/dL
SPECIFIC GRAVITY, URINE: 1.028 (ref 1.005–1.030)
UROBILINOGEN UA: 1 mg/dL (ref 0.0–1.0)
pH: 6.5 (ref 5.0–8.0)

## 2014-07-02 LAB — CBC WITH DIFFERENTIAL/PLATELET
Basophils Absolute: 0 10*3/uL (ref 0.0–0.1)
Basophils Relative: 1 % (ref 0–1)
Eosinophils Absolute: 0.3 10*3/uL (ref 0.0–0.7)
Eosinophils Relative: 4 % (ref 0–5)
HEMATOCRIT: 33.4 % — AB (ref 36.0–46.0)
HEMOGLOBIN: 11.3 g/dL — AB (ref 12.0–15.0)
LYMPHS PCT: 44 % (ref 12–46)
Lymphs Abs: 2.7 10*3/uL (ref 0.7–4.0)
MCH: 30.5 pg (ref 26.0–34.0)
MCHC: 33.8 g/dL (ref 30.0–36.0)
MCV: 90.3 fL (ref 78.0–100.0)
MONO ABS: 0.5 10*3/uL (ref 0.1–1.0)
MONOS PCT: 8 % (ref 3–12)
NEUTROS ABS: 2.6 10*3/uL (ref 1.7–7.7)
NEUTROS PCT: 43 % (ref 43–77)
Platelets: 208 10*3/uL (ref 150–400)
RBC: 3.7 MIL/uL — AB (ref 3.87–5.11)
RDW: 13.3 % (ref 11.5–15.5)
WBC: 6 10*3/uL (ref 4.0–10.5)

## 2014-07-02 LAB — RAPID URINE DRUG SCREEN, HOSP PERFORMED
Amphetamines: NOT DETECTED
Barbiturates: NOT DETECTED
Benzodiazepines: NOT DETECTED
COCAINE: NOT DETECTED
OPIATES: NOT DETECTED
TETRAHYDROCANNABINOL: POSITIVE — AB

## 2014-07-02 LAB — ETHANOL: Alcohol, Ethyl (B): <11 mg/dL (ref 0–11)

## 2014-07-02 LAB — PREGNANCY, URINE: Preg Test, Ur: NEGATIVE

## 2014-07-02 MED ORDER — LORAZEPAM 0.5 MG PO TABS
0.5000 mg | ORAL_TABLET | Freq: Once | ORAL | Status: AC
  Administered 2014-07-02: 0.5 mg via ORAL
  Filled 2014-07-02: qty 1

## 2014-07-02 MED ORDER — LORAZEPAM 1 MG PO TABS
1.0000 mg | ORAL_TABLET | Freq: Three times a day (TID) | ORAL | Status: DC | PRN
Start: 2014-07-02 — End: 2014-07-02

## 2014-07-02 MED ORDER — SERTRALINE HCL 50 MG PO TABS: 25.0000 mg | ORAL_TABLET | Freq: Every day | ORAL | Status: DC

## 2014-07-02 MED ORDER — LORAZEPAM 1 MG PO TABS: 1.0000 mg | ORAL_TABLET | Freq: Three times a day (TID) | ORAL | Status: DC | PRN

## 2014-07-02 MED ORDER — MAGNESIUM HYDROXIDE 400 MG/5ML PO SUSP: 30.0000 mL | Freq: Every day | ORAL | Status: DC | PRN

## 2014-07-02 MED ORDER — ONDANSETRON HCL 4 MG PO TABS: 4.0000 mg | ORAL_TABLET | Freq: Three times a day (TID) | ORAL | Status: DC | PRN

## 2014-07-02 MED ORDER — ALUM & MAG HYDROXIDE-SIMETH 200-200-20 MG/5ML PO SUSP: 30.0000 mL | ORAL | Status: DC | PRN

## 2014-07-02 MED ORDER — ZOLPIDEM TARTRATE 5 MG PO TABS: 5.0000 mg | ORAL_TABLET | Freq: Every evening | ORAL | Status: DC | PRN

## 2014-07-02 MED ORDER — NICOTINE 21 MG/24HR TD PT24: 21.0000 mg | MEDICATED_PATCH | Freq: Every day | TRANSDERMAL | Status: DC

## 2014-07-02 MED ORDER — IBUPROFEN 200 MG PO TABS: 600.0000 mg | ORAL_TABLET | Freq: Three times a day (TID) | ORAL | Status: DC | PRN

## 2014-07-02 MED ORDER — LORAZEPAM 0.5 MG PO TABS: 0.5000 mg | ORAL_TABLET | Freq: Once | ORAL | Status: DC

## 2014-07-02 MED ORDER — ACETAMINOPHEN 325 MG PO TABS
650.0000 mg | ORAL_TABLET | ORAL | Status: DC | PRN
  Administered 2014-07-03 – 2014-07-06 (×5): 650 mg via ORAL
  Filled 2014-07-02 (×5): qty 2

## 2014-07-02 MED ORDER — NICOTINE 21 MG/24HR TD PT24
21.0000 mg | MEDICATED_PATCH | Freq: Every day | TRANSDERMAL | Status: DC
Start: 2014-07-03 — End: 2014-07-07
  Administered 2014-07-03 – 2014-07-06 (×4): 21 mg via TRANSDERMAL
  Filled 2014-07-02 (×8): qty 1

## 2014-07-02 MED ORDER — TRAZODONE HCL 50 MG PO TABS
25.0000 mg | ORAL_TABLET | Freq: Every evening | ORAL | Status: DC | PRN
  Administered 2014-07-03 (×2): 25 mg via ORAL
  Filled 2014-07-02 (×3): qty 1

## 2014-07-02 MED ORDER — ACETAMINOPHEN 325 MG PO TABS
650.0000 mg | ORAL_TABLET | ORAL | Status: DC | PRN
Start: 2014-07-02 — End: 2014-07-02

## 2014-07-02 NOTE — ED Notes (Signed)
TTS at bedside. 
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
## 2014-07-02 NOTE — ED Notes (Signed)
[facility] called for transport. 

## 2014-07-02 NOTE — BH Assessment (Signed)
Assessment completed. Consulted with Alberteen SamFran Hobson, NP who recommended inpatient treatment. [facility]. Sarita BottomNicole Piscotta, PA-C has been notified of the recommendation and [facility].

## 2014-07-02 NOTE — ED Notes (Signed)
[facility], they will return call for report

## 2014-07-03 ENCOUNTER — Encounter (HOSPITAL_COMMUNITY): Payer: Self-pay | Admitting: *Deleted

## 2014-07-03 DIAGNOSIS — F4310 Post-traumatic stress disorder, unspecified: Principal | ICD-10-CM

## 2014-07-03 MED ORDER — LORAZEPAM 1 MG PO TABS
1.0000 mg | ORAL_TABLET | Freq: Three times a day (TID) | ORAL | Status: DC | PRN
  Administered 2014-07-04 – 2014-07-07 (×6): 1 mg via ORAL
  Filled 2014-07-03 (×6): qty 1

## 2014-07-03 MED ORDER — LORAZEPAM 2 MG/ML IJ SOLN
INTRAMUSCULAR | Status: AC
  Filled 2014-07-03: qty 1

## 2014-07-03 MED ORDER — LORAZEPAM 2 MG/ML IJ SOLN: 1.0000 mg | Freq: Once | INTRAMUSCULAR | Status: DC

## 2014-07-03 MED ORDER — LORAZEPAM 2 MG/ML IJ SOLN
1.0000 mg | Freq: Once | INTRAMUSCULAR | Status: AC
  Administered 2014-07-03: 1 mg via INTRAMUSCULAR

## 2014-07-03 NOTE — Progress Notes (Signed)
CSW was unable to complete assessment with Pt x2 however pt was with MD and sleeping.  Will have weekday CSW attempt again tomorrow to consult.
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
## 2014-07-03 NOTE — [facility] Group Notes (Signed)
[facility] LCSW Group Therapy  07/03/2014 4:16 PM  Type of Therapy:  Group Therapy  Participation Level:  Minimal  Participation Quality:  Appropriate  Affect:  Appropriate  Cognitive:  Oriented  Insight:  Developing/Improving  Engagement in Therapy:  Developing/Improving  Modes of Intervention:  Discussion, Education, Exploration, Rapport Building, Socialization and Support  Summary of Progress/Problems: Pt was engaged and was able to support others sharing in the group.  Pt did not express or identify any behaviors, however was engaged in conversation showed by body language.   Seabron SpatesVaughn, Kassem Kibbe Anne 07/03/2014, 4:16 PM

## 2014-07-03 NOTE — Progress Notes (Signed)
Psychoeducational Group Note  Date:  07/03/2014 Time:  0041  Group Topic/Focus:  Wrap-Up Group:   The focus of this group is to help patients review their daily goal of treatment and discuss progress on daily workbooks.  Participation Level: Did Not Attend  Participation Quality:  Not Applicable  Affect:  Not Applicable  Cognitive:  Not Applicable  Insight:  Not Applicable  Engagement in Group: Not Applicable  Additional Comments:  The patient did not attend group since she was admitted to the unit a few hours after the group was held.   Hazle CocaGOODMAN, Adriahna Shearman S 07/03/2014, 12:41 AM

## 2014-07-03 NOTE — [facility] Suicide Risk Assessment (Signed)
Suicide Risk Assessment  Admission Assessment     Nursing information obtained from:    Demographic factors:    Current Mental Status:    Loss Factors:    Historical Factors:    Risk Reduction Factors:    Total Time spent with patient: 1 hour  CLINICAL FACTORS:   Severe Anxiety and/or Agitation Depression:   Anhedonia Delusional Insomnia Unstable or Poor Therapeutic Relationship Previous Psychiatric Diagnoses and Treatments  Psychiatric Specialty Exam:     Blood pressure 119/82, pulse 103, temperature 98.8 F (37.1 C), temperature source Oral, resp. rate 18, height 5\' 5"  (1.651 m), weight 118 lb (53.524 kg).Body mass index is 19.64 kg/(m^2).  General Appearance: Guarded  Eye Contact::  Fair  Speech:  Slow  Volume:  Normal  Mood:  Dysphoric  Affect:  Congruent  Thought Process:  Coherent and Disorganized  Orientation:  Full (Time, Place, and Person)  Thought Content:  Delusions, Paranoid Ideation and Rumination  Suicidal Thoughts:  No  Homicidal Thoughts:  No  Memory:  Immediate;   Poor Recent;   Fair  Judgement:  Poor  Insight:  Shallow  Psychomotor Activity:  Decreased  Concentration:  Fair  Recall:  Poor  Fund of Knowledge:Fair  Language: Fair  Akathisia:  Negative  Handed:  Right  AIMS (if indicated):     Assets:  Desire for Improvement Social Support Vocational/Educational  Sleep:  Number of Hours: 4.75   Musculoskeletal: Strength & Muscle Tone: within normal limits Gait & Station: normal Patient leans: N/A  COGNITIVE FEATURES THAT CONTRIBUTE TO RISK:  Closed-mindedness Polarized thinking    SUICIDE RISK:   Moderate:  Frequent suicidal ideation with limited intensity, and duration, some specificity in terms of plans, no associated intent, good self-control, limited dysphoria/symptomatology, some risk factors present, and identifiable protective factors, including available and accessible social support.  PLAN OF CARE:  I certify that inpatient  services furnished can reasonably be expected to improve the patient's condition.  Kendra Blake 07/03/2014, 9:08 AM
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
## 2014-07-03 NOTE — Progress Notes (Signed)
Psychoeducational Group Note  Date: 07/03/2014 Time: 1015  Group Topic/Focus:  Making Healthy Choices:   The focus of this group is to help patients identify negative/unhealthy choices they were using prior to admission and identify positive/healthier coping strategies to replace them upon discharge.  Participation Level:  Minimal  Participation Quality:  Attentive  Affect:  Depressed  Cognitive:  Appropriate  Insight:  Engaged  Engagement in Group:  Engaged  Additional Comments:    07/03/2014,11:16 AM Rich Braveuke, Mavric Cortright Lynn

## 2014-07-03 NOTE — Progress Notes (Signed)
[facility] Group Notes:  (Nursing/MHT/Case Management/Adjunct)  Date:  07/03/2014  Time:  11:32 PM  Type of Therapy:  Psychoeducational Skills  Participation Level:  Minimal  Participation Quality:  Attentive  Affect:  Depressed  Cognitive:  Appropriate  Insight:  Lacking  Engagement in Group:  Lacking  Modes of Intervention:  Education  Summary of Progress/Problems: The patient expressed in group that she had a "suckly" day. While she did not have anything to say about her day, she did mention that the groups were informative. Her goal for tomorrow is to work on her temper.   Hazle CocaGOODMAN, Tahsin Benyo S 07/03/2014, 11:32 PM
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
## 2014-07-04 DIAGNOSIS — F411 Generalized anxiety disorder: Secondary | ICD-10-CM

## 2014-07-04 DIAGNOSIS — F332 Major depressive disorder, recurrent severe without psychotic features: Secondary | ICD-10-CM

## 2014-07-04 MED ORDER — TRAZODONE HCL 50 MG PO TABS
50.0000 mg | ORAL_TABLET | Freq: Every evening | ORAL | Status: DC | PRN
  Administered 2014-07-04: 50 mg via ORAL

## 2014-07-04 MED ORDER — ENSURE COMPLETE PO LIQD
237.0000 mL | Freq: Two times a day (BID) | ORAL | Status: DC
  Administered 2014-07-04 – 2014-07-05 (×3): 237 mL via ORAL

## 2014-07-04 NOTE — Progress Notes (Signed)
Nutrition Brief Note  Attempted x 3 to meet with pt, however each time she was on the phone. Noted pt had requested RN to give her Ensure, will order and attempt to meet with pt later in the week as schedule allows.   Charlott RakesHeather Gurley Climer MS, RD, LDN [phone] Pager [phone] Weekend/After Hours Pager

## 2014-07-04 NOTE — [facility] Group Notes (Signed)
[facility] L[facility] Dis[facility] Planning Group Note  07/04/2014 8:45 AM  Participation Quality: Alert, Appropriate and Oriented  Mood/Affect: "okay" mood; flat affect  Depression Rating: 0; as long as she is sleeping  Anxiety Rating: 0; as long as she is sleeping  Thoughts of Suicide: Pt denies SI/HI  Will you contract for safety? Yes  Current AVH: Pt denies  Plan for Discharge/Comments: Pt attended discharge planning group and actively participated in group. CSW provided pt with today's workbook. Pt slept through most of group, reports having no follow up as this is her first admission.  Pt is unsure of where she will be living as she does not want to return to her living arrangements prior to discharge.  Pt's goal is to get sleep.  Transportation Means: Pt reports access to transportation  Supports: No supports mentioned at this time  Chad CordialLauren Carter, LCSWA 07/04/2014 10:24 AM

## 2014-07-04 NOTE — ED Provider Notes (Signed)
Medical screening examination/treatment/procedure(s) were conducted as a shared visit with non-physician practitioner(s) and myself.  I personally evaluated the patient during the encounter.  Pt c/o feeling very anxious, at times paranoid thoughts. Denies SI.  Appears very anxious and tearful. Psych team consulted. dispo per psych team.   Suzi RootsKevin E Maelie Chriswell, MD 07/04/14 [id number]
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
## 2014-07-04 NOTE — BHH Group Notes (Signed)
BHH LCSW Group Therapy  07/04/2014 1:15pm   Type of Therapy: Group Therapy- Overcoming Obstacles  Participation Level: Minimal  Participation Quality:  Appropriate  Affect:  Appropriate   Cognitive: Alert and Oriented   Insight:  Developing/Improving   Engagement in Therapy: Developing/Improving and Engaged   Modes of Intervention: Clarification, Confrontation, Discussion, Education, Exploration, Limit-setting, Orientation, Problem-solving, Rapport Building, Dance movement psychotherapisteality Testing, Socialization and Support   Description of Group:   In this group patients will be encouraged to explore what they see as obstacles to their own wellness and recovery. They will be guided to discuss their thoughts, feelings, and behaviors related to these obstacles. The group will process together ways to cope with barriers, with attention given to specific choices patients can make. Each patient will be challenged to identify changes they are motivated to make in order to overcome their obstacles. This group will be process-oriented, with patients participating in exploration of their own experiences as well as giving and receiving support and challenge from other group members.  Pt was not present during much of the group, as she excused herself to go to the restroom.  Before Pt left, Pt identified her obstacle as negative influences in her life who continue to smoke marijuana.  Pt reports that she knows that smoking marijuana will not improve her situation, so she wants to stop smoking.   Therapeutic Modalities:   Cognitive Behavioral Therapy Solution Focused Therapy Motivational Interviewing Relapse Prevention Therapy    Chad CordialLauren Carter, LCSWA 07/04/2014 3:10 PM

## 2014-07-04 NOTE — Progress Notes (Signed)
D:  Patient's self inventory sheet, patient has poor sleep, sleep medication given to patient but did not help with her sleep.  Poor appetite, low energy level. Good concentration.  Denied SI.  Has felt lightheaded, pain, dizzy in past 24 hours.  Worst pain #8.  Has experienced back, shoulder, arm/hand pain in past 24 hours.  Was given pain medication, pain continues.  Goal for today is eating.  If her son needs her, she has to discharge herself. A:  Medications administered per MD orders.  Emotional support and encouragement given patient. R:  Denied SI and HI, contracts for safety.  Denied A/V hallucinations.  Safety maintained with 15 minute checks.

## 2014-07-05 DIAGNOSIS — F322 Major depressive disorder, single episode, severe without psychotic features: Secondary | ICD-10-CM

## 2014-07-05 MED ORDER — PRAZOSIN HCL 2 MG PO CAPS
2.0000 mg | ORAL_CAPSULE | Freq: Every day | ORAL | Status: DC
  Administered 2014-07-05 – 2014-07-06 (×2): 2 mg via ORAL
  Filled 2014-07-05 (×4): qty 1

## 2014-07-05 MED ORDER — PRAZOSIN HCL 1 MG PO CAPS
1.0000 mg | ORAL_CAPSULE | Freq: Every day | ORAL | Status: DC
  Filled 2014-07-05 (×2): qty 1

## 2014-07-05 MED ORDER — ENSURE COMPLETE PO LIQD
237.0000 mL | Freq: Three times a day (TID) | ORAL | Status: DC
  Administered 2014-07-05 – 2014-07-07 (×7): 237 mL via ORAL

## 2014-07-05 MED ORDER — CITALOPRAM HYDROBROMIDE 20 MG PO TABS
20.0000 mg | ORAL_TABLET | Freq: Every day | ORAL | Status: DC
  Administered 2014-07-05 – 2014-07-07 (×3): 20 mg via ORAL
  Filled 2014-07-05 (×6): qty 1

## 2014-07-05 MED ORDER — QUETIAPINE FUMARATE 25 MG PO TABS
25.0000 mg | ORAL_TABLET | Freq: Two times a day (BID) | ORAL | Status: DC
  Administered 2014-07-05 – 2014-07-07 (×5): 25 mg via ORAL
  Filled 2014-07-05 (×9): qty 1

## 2014-07-05 NOTE — Progress Notes (Signed)
NUTRITION ASSESSMENT  Pt identified as at risk on the Malnutrition Screen Tool  INTERVENTION: 1. Educated patient on the importance of nutrition and encouraged intake of food and beverages. 2. Discussed high calorie/protein diet  3. Supplements: Ensure Complete QID   NUTRITION DIAGNOSIS: Unintentional weight loss related to sub-optimal intake as evidenced by pt report.   Goal: Pt to meet >/= 90% of their estimated nutrition needs.  Monitor:  PO intake  Assessment:  Pt admitted for for symptoms of PTSD; irritability, anxiety, flash backs, nightmares. Pt is 118 pounds, said she used to weigh 135 pounds 5 days ago. Said she hasn't been eating much of anything other than oodles of noodles for the past 5 days. Has struggled with fast metabolism all her life and wants to gain weight. Has been drinking Ensure Complete BID, agreeable to getting more. States sometimes when she eats food she gags, denies any vomiting, and states this has been improving today - said she ate most of her lunch.    33 y.o. female  Height: Ht Readings from Last 1 Encounters:  07/02/14 5\' 5"  (1.651 m)    Weight: Wt Readings from Last 1 Encounters:  07/02/14 118 lb (53.524 kg)    Weight Hx: Wt Readings from Last 10 Encounters:  07/02/14 118 lb (53.524 kg)  03/25/14 122 lb (55.339 kg)  04/20/13 125 lb (56.7 kg)    BMI:  Body mass index is 19.64 kg/(m^2). Pt meets criteria for normal weight based on current BMI.  Estimated Nutritional Needs: Kcal: 25-30 kcal/kg Protein: > 1 gram protein/kg Fluid: 1 ml/kcal  Diet Order: General Pt is also offered choice of unit snacks mid-morning and mid-afternoon.  Pt is eating as desired.   Lab results and medications reviewed.   Charlott RakesHeather Morio Widen MS, RD, LDN [phone] Pager [phone] Weekend/After Hours Pager

## 2014-07-05 NOTE — Progress Notes (Signed)
Didn't attend group 

## 2014-07-05 NOTE — Progress Notes (Addendum)
D:  Patient's self inventory form, patient has fair sleep, sleep medication was helpful.  Fair appetite, low energy level, good concentration.  Rated depression 3, hopeless 2, anxiety 7.  Denied withdrawals.  Denied SI.  Denied physical problems.  Has felt pain in past 24 hours, worst pain 8, right side of body.  Takes pain medication which has not been helpful.  Goal is to get better.  [facility]. A:  Medications administered per MD orders.  Emotional support and encouragement given patient. R:  Denied SI and HI.  Denied A/V hallucinations.  Safety maintained with 15 minute checks.  1900  Patient came back from dinner, upset, worried over her young son.  Stated she drank juice for dinner.  Vomited juice.  Ativan given for anxiety.  Stated she worries over her son and that causes her nausea.  Patient has been resting comfortably in bed.

## 2014-07-05 NOTE — Progress Notes (Signed)
The focus of this group is to educate the patient on the purpose and policies of crisis stabilization and provide a format to answer questions about their admission.  The group details unit policies and expectations of patients while admitted.  Patient attended morning nurse education orientation group.  Patient actively participated, appropriate affect, alert, appropriate insight and engagement.  Today patient will work on 3 goals for discharge.  

## 2014-07-05 NOTE — Tx Team (Signed)
Interdisciplinary Treatment Plan Update   Date Reviewed:  07/05/2014  Time Reviewed:  9:01 AM  Progress in Treatment:   Attending groups: Yes Participating in groups: Yes Taking medication as prescribed: Yes  Tolerating medication: Yes Family/Significant other contact made:  No, but will ask patient for consent for collateral contact Patient understands diagnosis: Yes  Discussing patient identified problems/goals with staff: Yes Medical problems stabilized or resolved: Yes Denies suicidal/homicidal ideation: Yes Patient has not harmed self or others: Yes  For review of initial/current patient goals, please see plan of care.  Estimated Length of Stay:  3-5 days  Reasons for Continued Hospitalization:  Anxiety Depression Medication stabilization   New Problems/Goals identified:    Discharge Plan or Barriers:   Home with outpatient follow up to be determined  Additional Comments:  Attendees:  Patient:  07/05/2014 9:01 AM   Signature:  Sallyanne HaversF. Cobos, MD 07/05/2014 9:01 AM  Signature:  07/05/2014 9:01 AM  Signature: 07/05/2014 9:01 AM  Signature:Beverly Terrilee CroakKnight, RN 07/05/2014 9:01 AM  Signature:  Neill Loftarol Davis RN 07/05/2014 9:01 AM  Signature:  Juline PatchQuylle Jamarea Selner, LCSW 07/05/2014 9:01 AM  Signature:  Chad CordialLauren Carter, LCSW-A 07/05/2014 9:01 AM  Signature:  Leisa LenzValerie Enoch, Care Coordinator [facility] 07/05/2014 9:01 AM  Signature:   07/05/2014 9:01 AM  Signature: 07/05/2014  9:01 AM  Signature:   Onnie BoerJennifer Clark, RN URCM 07/05/2014  9:01 AM  Signature: 07/05/2014  9:01 AM    Scribe for Treatment Team:   Juline PatchQuylle Jandiel Magallanes,  07/05/2014 9:01 AM

## 2014-07-05 NOTE — BHH Group Notes (Signed)
BHH LCSW Group Therapy  07/05/2014 4:25 PM  Type of Therapy:  Group Therapy  Participation Level:  Patient was meeting with MD    Wynn BankerHodnett, Asuka Dusseau Hairston 07/05/2014, 4:25 PM
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
## 2014-07-05 NOTE — Progress Notes (Signed)
D: Patient denies SI/HI/AVH. Patient rates hopelessness as 1,  depression as 8, and anxiety as 5.  Patient affect is *sad. Mood is depressed.  Patient states, "I'm tired.  I just need rest and the medication I got last night didn't work."  Patient did NOT attend evening group. Patient visible on the milieu. No distress noted. A: Support and encouragement offered. Trazodone increased to 50 mg.  Scheduled medications given to pt. Q 15 min checks continued for patient safety. R: Patient receptive. Patient remains safe on the unit.
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
## 2014-07-05 NOTE — Progress Notes (Signed)
Recreation Therapy Notes  Animal-Assisted Activity/Therapy (AAA/T) Program Checklist/Progress Notes Patient Eligibility Criteria Checklist & Daily Group note for Rec Tx Intervention  Date: 07.28.2015 Time: 2:45pm [address]    AAA/T Program Assumption of Risk Form signed by Patient/ or Parent Legal Guardian yes  Patient is free of allergies or sever asthma yes  Patient reports no fear of animals yes  Patient reports no history of cruelty to animals yes   Patient understands his/her participation is voluntary yes  Patient washes hands before animal contact yes  Patient washes hands after animal contact yes  Behavioral Response: Appropriate   Education: Hand Washing, Appropriate Animal Interaction   Education Outcome: Acknowledges understanding   Clinical Observations/Feedback: Patient attended session for approximately 5 minutes, during the time she was in group session she stopped to gaze at therapy dog from a distance and rearrange papers on bookshelf in dayroom. Patient made no statements upon entering or exiting room, but she did smile brightly at LRT before exiting space.   Marykay Lexenise L Mellie Buccellato, LRT/CTRS  Jearl KlinefelterBlanchfield, Jakim Drapeau L 07/05/2014 4:37 PM

## 2014-07-06 DIAGNOSIS — F3289 Other specified depressive episodes: Secondary | ICD-10-CM

## 2014-07-06 DIAGNOSIS — F329 Major depressive disorder, single episode, unspecified: Secondary | ICD-10-CM

## 2014-07-06 NOTE — Progress Notes (Signed)
Pt has been up and in the milieu more this evening that yesterday.  She reports she is feeling better and tells this Clinical research associatewriter that she was able to eat dinner and keep it down.  Writer observed pt on the phone a couple of times throughout the evening and seemed to be having pleasant conversations.  Pt says she is eager to discharge soon and be with her son.  Pt says her thoughts are getting a little better.  Pt denies SI/HI/AV at this time.  Pt has been pleasant/cooperative with staff and peers.  Pt plans to return home at discharge.  Support and encouragement offered.  Safety maintained with q15 minute checks.

## 2014-07-06 NOTE — Progress Notes (Signed)
Adult Psychoeducational Group Note  Date:  07/06/2014 Time:  11:04 AM  Group Topic/Focus:  Personal Choices and Values:   The focus of this group is to help patients assess and explore the importance of values in their lives, how their values affect their decisions, how they express their values and what opposes their expression.  Participation Level:  Active  Participation Quality:  Appropriate and Drowsy  Affect:  Appropriate  Cognitive:  Appropriate  Insight: Appropriate  Engagement in Group:  Engaged  Modes of Intervention:  Discussion  Additional Comments:    Lauralee Evenerowlin, Liylah Najarro Jvette 07/06/2014, 11:04 AM

## 2014-07-06 NOTE — [facility] Group Notes (Signed)
[facility]   07/06/2014 11:50 AM    Participation Quality:  Appropraite  Mood/Affect:  Appropriate  Depression Rating:  8  Anxiety Rating:  5  Thoughts of Suicide:  No  Will you contract for safety?   NA  Current AVH:  No  Plan for Discharge/Comments:  Patient attended discharge planning group and actively participated in group.  She [facility] with [facility].  CSW provided all participants with daily workbook.   Transportation Means: Patient has transportation.   Supports:  Patient has a support system.   Derrica Sieg, Joesph JulyQuylle Hairston

## 2014-07-06 NOTE — Progress Notes (Signed)
D: Patient appropriate and cooperative with staff and peers. Patient's affect depressed and mood is anxious. She reported on the self inventory sheet that she's sleeping fair and appetite/ability to concentrate are poor. Patient rates depression/anxiety "8" and feelings of hopelessness "0". She's participating in groups, interactive with peers in the dayroom and visible in the milieu. Patient adheres to medication regimen.  A: Support and encouragement provided to patient. Administered scheduled medications per ordering MD. Monitor Q15 minute checks for safety.  R: Patient receptive. Denies SI/HI/AVH. Patient remains safe on the unit.

## 2014-07-06 NOTE — BHH Group Notes (Signed)
Adult Psychoeducational Group Note  Date:  07/06/2014 Time:  11:35 PM  Group Topic/Focus:  Wrap-Up Group:   The focus of this group is to help patients review their daily goal of treatment and discuss progress on daily workbooks.  Participation Level:  Active  Participation Quality:  Appropriate  Affect:  Flat  Cognitive:  Alert, Appropriate and Oriented  Insight: Improving  Engagement in Group:  Improving  Modes of Intervention:  Discussion and Support  Additional Comments:  Pt stated that her day was "pretty good" that it started out bad but get better throughout the day. Pt reported that she ate all of her food today and that she is not "shaking" today. One thing the pt likes to do for fun is play basketball and one thing that makes her happy is her son and god daughter.   Dwain SarnaBowman, Jenne Sellinger P 07/06/2014, 11:35 PM
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
## 2014-07-06 NOTE — BHH Group Notes (Signed)
BHH LCSW Group Therapy  Emotional Regulation 1:15 - 2: 30 PM        07/06/2014     Type of Therapy:  Group Therapy  Participation Level:  Appropriate  Participation Quality:  Appropriate  Affect:  Appropriate  Cognitive:  Attentive Appropriate  Insight:  Developing/Improving Engaged  Engagement in Therapy:  Developing/Improving Engaged  Modes of Intervention:  Discussion Exploration Problem-Solving Supportive  Summary of Progress/Problems:  Group topic was emotional regulations.  Patient participated in the discussion and was able to identify an emotion that needed to regulated.  Patient shared she has a lot of anger.  She shared she will walk away but often person will follow her rather than giving her time to cool off.  Patient was able to identify approprite coping skills.  Wynn BankerHodnett, Gianno Volner Hairston 07/06/2014

## 2014-07-07 MED ORDER — PRAZOSIN HCL 2 MG PO CAPS: 2.0000 mg | ORAL_CAPSULE | Freq: Every day | ORAL | Status: DC

## 2014-07-07 MED ORDER — QUETIAPINE FUMARATE 25 MG PO TABS: 25.0000 mg | ORAL_TABLET | Freq: Two times a day (BID) | ORAL | Status: DC

## 2014-07-07 MED ORDER — LORAZEPAM 1 MG PO TABS: 1.0000 mg | ORAL_TABLET | Freq: Three times a day (TID) | ORAL | Status: DC | PRN

## 2014-07-07 MED ORDER — TRAZODONE HCL 50 MG PO TABS: 50.0000 mg | ORAL_TABLET | Freq: Every evening | ORAL | Status: DC | PRN

## 2014-07-07 MED ORDER — CITALOPRAM HYDROBROMIDE 20 MG PO TABS: 20.0000 mg | ORAL_TABLET | Freq: Every day | ORAL | Status: DC
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
## 2014-07-07 NOTE — BHH Group Notes (Signed)
[facility] Group Therapy 07/07/2014 1:15pm  Type of Therapy: Mental Health Association Presentation  Participation Level: Active  Participation Quality: Attentive  Affect: Appropriate  Cognitive: Oriented  Insight: Developing/Improving  Engagement in Therapy: Engaged  Modes of Intervention: Discussion, Education and Socialization  Summary of Progress/Problems: Mental Health Association (MHA) Speaker came to talk about his personal journey with substance abuse and addiction. The pt processed ways by which to relate to the speaker. MHA speaker provided handouts and educational information pertaining to groups and services offered by the [facility]. Pt was attentive during the group session, taking materials provided by the speaker.     Chad CordialLauren Carter, LCSWA 07/07/2014 3:06 PM

## 2014-07-07 NOTE — Progress Notes (Signed)
Adult Psychoeducational Group Note  Date:  07/07/2014 Time:  8:45 AM  Group Topic/Focus:  Morning Wellness Group  Participation Level:  Minimal  Participation Quality:  Appropriate and Drowsy  Affect:  Appropriate  Cognitive:  Alert and Oriented  Insight: Improving  Engagement in Group:  Engaged  Modes of Intervention:  Discussion  Additional Comments: Patient verbalized that her goal is to learn to open up to others and do not hold things inside.  Harold BarbanByrd, Ronecia E 07/07/2014, 10:30 AM

## 2014-07-07 NOTE — Progress Notes (Signed)
[facility] :  Will you be returning to the same living situation after discharge: Yes,  Patient is returning to her home. At discharge, do you have transportation home?:Yes,  Patient to arrange transportation home. Do you have the ability to pay for your medications:  No, patient will be assisted with indigent medications.   Release of information consent forms completed and in the chart;  Patient's signature needed at discharge.  Patient to Follow up at: Follow-up Information   [facility] with [facility] On 07/08/2014. ([facility] on Friday, July 08, 2014 or any weekday between 8AM - 12:Noon or 1-3PM for medication management/counseling)    Contact information:   [address] [place], [place]   [address]  [phone]      Patient denies SI/HI:  Patient no longer endorsing SI/HI or other thoughts of self harm.   Safety Planning and Suicide Prevention discussed:  .Reviewed with all patients during discharge planning group   Gautam Langhorst, Joesph JulyQuylle Hairston 07/07/2014, 12:04 PM

## 2014-07-07 NOTE — Progress Notes (Signed)
Adult Psychoeducational Group Note  Date:  07/07/2014 Time:  10:00am Group Topic/Focus:  Making Healthy Choices:   The focus of this group is to help patients identify negative/unhealthy choices they were using prior to admission and identify positive/healthier coping strategies to replace them upon discharge.  Participation Level:  Did Not Attend  Participation Quality:    Affect:   Cognitive:    Insight:   Engagement in Group:    Modes of Intervention:   Additional Comments:  Pt did not attend group  Shelly BombardGarner, Wilhelmina Hark D 07/07/2014, 1:02 PM

## 2014-07-07 NOTE — Discharge Summary (Signed)
Physician Discharge Summary Note  Patient:  Kendra Blake is an 33 y.o., female MRN:  [id number] DOB:  [birth year] Patient phone:  [phone] (home)  Patient address:   [address] [place] [address],  Total Time spent with patient: 20 minutes  Date of Admission:  07/02/2014 Date of Discharge: 07/07/2014  Reason for Admission:  33 year old female with PTSD admitted to [facility] for PTSD symptoms ie: irritability, flashbacks, nightmares and anxiety.  She reports to Clinical research associate that she feels much better since admission.  Denies SI, HI, or AVH.  When asked by writer how she will she go about avoiding triggers she explains that she will not return to her former living environment until "things settle down".  She expresses that the Ativan taken while admitted at [facility] helped to control her symptoms and that over time she has required less of the medication.  She is pleasant, calm, cooperative and engages in conversation.      Discharge Diagnoses: Active Problems:   Post traumatic stress disorder (PTSD)   Psychiatric Specialty Exam: Physical Exam  ROS  Blood pressure 114/74, pulse 121, temperature 99 F (37.2 C), temperature source Oral, resp. rate 18, height 5\' 5"  (1.651 m), weight 53.524 kg (118 lb).Body mass index is 19.64 kg/(m^2).  General Appearance: Negative  Eye Contact::  Good  Speech:  Clear and Coherent  Volume:  Normal  Mood:  Negative  Affect:  Appropriate  Thought Process:  Goal Directed; organized  Orientation:  Full (Time, Place, and Person)  Thought Content:  Negative  Suicidal Thoughts:  No  Homicidal Thoughts:  No  Memory:  Immediate;   Good Recent;   Good  Judgement:  Fair  Insight:  Good  Psychomotor Activity:  Negative  Concentration:  Good  Recall:  Good  Fund of Knowledge:Good  Language: Good  Akathisia:  Negative  Handed:    AIMS (if indicated):     Assets:  Communication Skills Desire for Improvement  Sleep:  Number of Hours: 6.5    Past  Psychiatric History: Diagnosis: PTSD; MDD  Hospitalizations: None  Outpatient Care: in High School seen by counselor   Substance Abuse Care: No   Self-Mutilation: No  Suicidal Attempts: No   Violent Behaviors:    Musculoskeletal: Strength & Muscle Tone: within normal limits Gait & Station: normal   Trauma-Stressor Disorders:  Posttraumatic Stress Disorder (309.81) Depressive Disorders:  Major Depressive Disorder - Severe (296.23)  Axis Diagnosis:   AXIS I:  Post Traumatic Stress Disorder and Major Depressive Disorder  AXIS II:  Deferred AXIS III:   Past Medical History  Diagnosis Date  . Anemia    AXIS IV:  problems related to social environment AXIS V:  61-70 mild symptoms  Level of Care:  OP; Follow up with [facility] On 07/08/2014. (Please to to [facility] walk in clinic on Friday, July 08, 2014 or any weekday between 8AM - 12:Noon or 1-3PM for medication management/counseling)  Contact information:  [address]  [place], [place] [address]  [phone]    Hospital Course:  5 days   Consults:  None  Significant Diagnostic Studies:  labs: reviewed upon admission- stable   Discharge Vitals:   Blood pressure 114/74, pulse 121, temperature 99 F (37.2 C), temperature source Oral, resp. rate 18, height 5\' 5"  (1.651 m), weight 53.524 kg (118 lb). Body mass index is 19.64 kg/(m^2). Lab Results:   No results found for this or any previous visit (from the past 72  hour(s)).  Physical Findings: AIMS: Facial and Oral Movements Muscles of Facial Expression: None, normal Lips and Perioral Area: None, normal Jaw: None, normal Tongue: None, normal,Extremity Movements Upper (arms, wrists, hands, fingers): None, normal Lower (legs, knees, ankles, toes): None, normal, Trunk Movements Neck, shoulders, hips: None, normal, Overall Severity Severity of abnormal movements (highest score from questions above): None, normal Incapacitation due to abnormal movements:  None, normal Patient's awareness of abnormal movements (rate only patient's report): No Awareness, Dental Status Current problems with teeth and/or dentures?: No Does patient usually wear dentures?: No  CIWA:  CIWA-Ar Total: 1 COWS:  COWS Total Score: 2  Psychiatric Specialty Exam: See Psychiatric Specialty Exam and Suicide Risk Assessment completed by Attending Physician prior to discharge.  [facility]  Is patient on multiple antipsychotic therapies at discharge:  No   Has Patient had three or more failed trials of antipsychotic monotherapy by history:  No  Recommended Plan for Multiple Antipsychotic Therapies: NA     Medication List       Indication   citalopram 20 MG tablet  Commonly known as:  CELEXA  Take 1 tablet (20 mg total) by mouth daily.   Indication:  Depression, Posttraumatic Stress Disorder     guaifenesin 100 MG/5ML syrup  Commonly known as:  ROBITUSSIN  Take 100 mg by mouth 3 (three) times daily as needed for cough.      LORazepam 1 MG tablet  Commonly known as:  ATIVAN  Take 1 tablet (1 mg total) by mouth every 8 (eight) hours as needed for anxiety.   Indication:  Feeling Anxious, Anxiousness associated with Depression     prazosin 2 MG capsule  Commonly known as:  MINIPRESS  Take 1 capsule (2 mg total) by mouth at bedtime.      QUEtiapine 25 MG tablet  Commonly known as:  SEROQUEL  Take 1 tablet (25 mg total) by mouth 2 (two) times daily.      traZODone 50 MG tablet  Commonly known as:  DESYREL  Take 1 tablet (50 mg total) by mouth at bedtime as needed and may repeat dose one time if needed for sleep.            Follow-up Information   Follow [facility] with [facility] On 07/08/2014. ([facility], July 08, 2014 or any weekday between 8AM - 12:Noon or 1-3PM for medication management/counseling)    Contact information:   [address] [place], [place]    [id number]  [phone]      Follow-up recommendations:  Activity:  as tolerated Diet:  regular   Comments:  Reviewed f/u information with patient as she reports questions Clinical research associatewriter re: follow-up stating that she forgot where/when she was to follow-up. Advised to keep/show up for follow-up  Total Discharge Time:  Less than 30 minutes.  SignedKizzie Fantasia: BURKETT, EVANNA CORI 07/07/2014, 3:33 PM   Patient seen, Suicide Assessment Completed.  Disposition Plan Reviewed

## 2014-07-07 NOTE — Progress Notes (Signed)
Discharge Note: Discharge instructions & prescriptions given to patient. Patient verbalized understanding of discharge instructions and prescriptions. Returned belongings to patient. Denies SI/HI/AVH. Patient d/c without incident to the front lobby and [facility] by security [facility].

## 2014-07-07 NOTE — [facility] Suicide Risk Assessment (Signed)
[facility] INPATIENT:  Family/Significant Other Suicide Prevention Education  Suicide Prevention Education:  Education Completed; Kendra Blake, Brother, [phone];  has been identified by the patient as the family member/significant other with whom the patient will be residing, and identified as the person(s) who will aid the patient in the event of a mental health crisis (suicidal ideations/suicide attempt).  With written consent from the patient, the family member/significant other has been provided the following suicide prevention education, prior to the and/or following the discharge of the patient.  The suicide prevention education provided includes the following:  Suicide risk factors  Suicide prevention and interventions  National Suicide Hotline telephone number  [facility] assessment telephone number  [facility] Assistance 911  [facility] and/or Residential Mobile Crisis Unit telephone number  Request made of family/significant other to:  Remove weapons (e.g., guns, rifles, knives), all items previously/currently identified as safety concern. Brother advised patient does not have access to weapons.    Remove drugs/medications (over-the-counter, prescriptions, illicit drugs), all items previously/currently identified as a safety concern.  The family member/significant other verbalizes understanding of the suicide prevention education information provided.  The family member/significant other agrees to remove the items of safety concern listed above.  Kendra Blake 07/07/2014, 9:12 AM

## 2014-07-12 NOTE — Progress Notes (Signed)
Patient Discharge Instructions:  After Visit Summary (AVS):   Faxed to:  07/12/14 Discharge Summary Note:   Faxed to:  07/12/14 Psychiatric Admission Assessment Note:   Faxed to:  07/12/14 Suicide Risk Assessment - Discharge Assessment:   Faxed to:  07/12/14 Faxed/Sent to the Next Level Care provider:  07/12/14 [facility] @ [phone]  Jerelene ReddenSheena E Streator, 07/12/2014, 3:08 PM

## 2015-03-20 ENCOUNTER — Encounter (HOSPITAL_COMMUNITY): Payer: Self-pay | Admitting: *Deleted

## 2015-03-20 ENCOUNTER — Emergency Department (HOSPITAL_COMMUNITY)
Admission: EM | Admit: 2015-03-20 | Discharge: 2015-03-20 | Disposition: A | Discharge status: Home / Self Care | Payer: Medicaid Other | Attending: Emergency Medicine | Admitting: Emergency Medicine

## 2015-03-20 DIAGNOSIS — Y990 Civilian activity done for income or pay: Secondary | ICD-10-CM | POA: Insufficient documentation

## 2015-03-20 DIAGNOSIS — Z79899 Other long term (current) drug therapy: Secondary | ICD-10-CM | POA: Insufficient documentation

## 2015-03-20 DIAGNOSIS — Y9389 Activity, other specified: Secondary | ICD-10-CM | POA: Insufficient documentation

## 2015-03-20 DIAGNOSIS — Y9289 Other specified places as the place of occurrence of the external cause: Secondary | ICD-10-CM | POA: Insufficient documentation

## 2015-03-20 DIAGNOSIS — Z23 Encounter for immunization: Secondary | ICD-10-CM | POA: Insufficient documentation

## 2015-03-20 DIAGNOSIS — W458XXA Other foreign body or object entering through skin, initial encounter: Secondary | ICD-10-CM | POA: Insufficient documentation

## 2015-03-20 DIAGNOSIS — Z720 Tobacco use: Secondary | ICD-10-CM | POA: Insufficient documentation

## 2015-03-20 DIAGNOSIS — S60351A Superficial foreign body of right thumb, initial encounter: Secondary | ICD-10-CM | POA: Insufficient documentation

## 2015-03-20 DIAGNOSIS — Z862 Personal history of diseases of the blood and blood-forming organs and certain disorders involving the immune mechanism: Secondary | ICD-10-CM | POA: Insufficient documentation

## 2015-03-20 MED ORDER — TETANUS-DIPHTH-ACELL PERTUSSIS 5-2.5-18.5 LF-MCG/0.5 IM SUSP
0.5000 mL | Freq: Once | INTRAMUSCULAR | Status: AC
  Administered 2015-03-20: 0.5 mL via INTRAMUSCULAR
  Filled 2015-03-20: qty 0.5

## 2015-03-20 MED ORDER — CEPHALEXIN 500 MG PO CAPS: 500.0000 mg | ORAL_CAPSULE | Freq: Four times a day (QID) | ORAL | Status: DC

## 2015-03-20 MED ORDER — TRAMADOL HCL 50 MG PO TABS: 50.0000 mg | ORAL_TABLET | Freq: Four times a day (QID) | ORAL | Status: DC | PRN

## 2015-03-20 MED ORDER — LIDOCAINE HCL (PF) 1 % IJ SOLN
5.0000 mL | Freq: Once | INTRAMUSCULAR | Status: AC
  Administered 2015-03-20: 5 mL via INTRADERMAL
  Filled 2015-03-20: qty 5

## 2015-03-20 NOTE — ED Notes (Signed)
Pt reports she got part of a can open stuck under her right thumb. Pain 8/10. No bleeding.

## 2015-03-20 NOTE — ED Notes (Signed)
Pt reports she was cleaning a can opener while at work and states that his R thumb got caught in a piece of metal.  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

## 2015-03-20 NOTE — ED Provider Notes (Signed)
CSN: [id number]     Arrival date & time 03/20/15  1600 History   First MD Initiated Contact with Patient 03/20/15 1642     Chief Complaint  Patient presents with  . Finger Injury     (Consider location/radiation/quality/duration/timing/severity/associated sxs/prior Treatment) The history is provided by the patient and medical records.    This is a 34 year old female with no significant past medical history presenting to the ED for right thumb injury. Patient states she was at work and opening a can when a piece of metal got wedged underneath her fingernail. She states they cut off part of the middle, but she still has a small piece wedged underneath her fingernail. Pain localized to fingernail only. Nail remains intact.  No active bleeding. Date of last tetanus unknown.  Past Medical History  Diagnosis Date  . Anemia    Past Surgical History  Procedure Laterality Date  . Chest surgery    . Ligament repair    . Cartilage surgery     History reviewed. No pertinent family history. History  Substance Use Topics  . Smoking status: Current Every Day Smoker -- 0.50 packs/day  . Smokeless tobacco: Not on file  . Alcohol Use: Yes     Comment: occasional   OB History    No data available     Review of Systems  Musculoskeletal: Positive for arthralgias.  All other systems reviewed and are negative.     Allergies  Review of patient's allergies indicates no known allergies.  Home Medications   Prior to Admission medications   Medication Sig Start Date End Date Taking? Authorizing Provider  citalopram (CELEXA) 20 MG tablet Take 1 tablet (20 mg total) by mouth daily. 07/07/14   Kizzie FantasiaEvanna Burkett, NP  guaifenesin (ROBITUSSIN) 100 MG/5ML syrup Take 100 mg by mouth 3 (three) times daily as needed for cough.    Historical Provider, MD  LORazepam (ATIVAN) 1 MG tablet Take 1 tablet (1 mg total) by mouth every 8 (eight) hours as needed for anxiety. 07/07/14   Kizzie FantasiaEvanna Burkett, NP  prazosin  (MINIPRESS) 2 MG capsule Take 1 capsule (2 mg total) by mouth at bedtime. 07/07/14   Kizzie FantasiaEvanna Burkett, NP  QUEtiapine (SEROQUEL) 25 MG tablet Take 1 tablet (25 mg total) by mouth 2 (two) times daily. 07/07/14   Kizzie FantasiaEvanna Burkett, NP  traZODone (DESYREL) 50 MG tablet Take 1 tablet (50 mg total) by mouth at bedtime as needed and may repeat dose one time if needed for sleep. 07/07/14   Evanna Burkett, NP   BP 120/71 mmHg  Pulse 79  Temp(Src) 98.4 F (36.9 C) (Oral)  Resp 16  SpO2 100%   Physical Exam  Constitutional: She is oriented to person, place, and time. She appears well-developed and well-nourished. No distress.  HENT:  Head: Normocephalic and atraumatic.  Mouth/Throat: Oropharynx is clear and moist.  Eyes: Conjunctivae and EOM are normal. Pupils are equal, round, and reactive to light.  Neck: Normal range of motion. Neck supple.  Cardiovascular: Normal rate, regular rhythm and normal heart sounds.   Pulmonary/Chest: Effort normal and breath sounds normal. She has no wheezes. She has no rales.  Musculoskeletal: Normal range of motion. She exhibits no edema.  Tiny metallic fragment noted underneath distal nailbed of right thumb; no active bleeding; nail remains intact; full flexion/extension of finger maintained; normal sensation; normal cap refill  Neurological: She is alert and oriented to person, place, and time.  Skin: Skin is warm and dry. She is not diaphoretic.  Psychiatric: She has a normal mood and affect.  Nursing note and vitals reviewed.   ED Course  FOREIGN BODY REMOVAL Date/Time: 03/20/2015 5:04 PM Performed by: Garlon Hatchet Authorized by: Garlon Hatchet Consent: Verbal consent obtained. Risks and benefits: risks, benefits and alternatives were discussed Consent given by: patient Patient understanding: patient states understanding of the procedure being performed Patient consent: the patient's understanding of the procedure matches consent given Required items:  required blood products, implants, devices, and special equipment available Patient identity confirmed: verbally with patient Body area: skin General location: upper extremity Location details: right thumb Anesthesia: local infiltration Local anesthetic: lidocaine 1% without epinephrine Anesthetic total: 1 ml Patient sedated: no Patient restrained: no Localization method: visualized Removal mechanism: scalpel Dressing: antibiotic ointment and dressing applied Tendon involvement: none Depth: subcutaneous Complexity: simple 1 objects recovered. Objects recovered: piece of metal Post-procedure assessment: foreign body removed Patient tolerance: Patient tolerated the procedure well with no immediate complications   (including critical care time) Labs Review Labs Reviewed - No data to display  Imaging Review No results found.   EKG Interpretation None      MDM   Final diagnoses:  Foreign body of thumb, right, initial encounter   34 year old female with small metal fragment from can stuck under her right thumbnail. Date of last tetanus unknown.  It was anesthetized with 1% lidocaine without epi and metal fragment retrieved with a scalpel. Patient tolerated procedure well. Tetanus was updated. Will discharge home on antibiotics and pain meds, encouraged home wound care.  Discussed plan with patient, he/she acknowledged understanding and agreed with plan of care.  Return precautions given for new or worsening symptoms.  Garlon Hatchet, PA-C 03/20/15 1734  Doug Sou, MD 03/20/15 [phone]

## 2015-03-20 NOTE — Discharge Instructions (Signed)
Take the prescribed medication as directed.  Keep finger clean and dry at home. Follow-up with hand surgery, Dr. Izora Ribasoley if any problems arise. Return to the ED for new or worsening symptoms.

## 2016-02-26 ENCOUNTER — Emergency Department (HOSPITAL_COMMUNITY)
Admission: EM | Admit: 2016-02-26 | Discharge: 2016-02-26 | Disposition: A | Discharge status: Home / Self Care | Payer: Self-pay | Attending: Emergency Medicine | Admitting: Emergency Medicine

## 2016-02-26 ENCOUNTER — Encounter (HOSPITAL_COMMUNITY): Payer: Self-pay

## 2016-02-26 ENCOUNTER — Emergency Department (HOSPITAL_COMMUNITY): Payer: Self-pay

## 2016-02-26 DIAGNOSIS — F419 Anxiety disorder, unspecified: Secondary | ICD-10-CM | POA: Insufficient documentation

## 2016-02-26 DIAGNOSIS — Z792 Long term (current) use of antibiotics: Secondary | ICD-10-CM | POA: Insufficient documentation

## 2016-02-26 DIAGNOSIS — Z862 Personal history of diseases of the blood and blood-forming organs and certain disorders involving the immune mechanism: Secondary | ICD-10-CM | POA: Insufficient documentation

## 2016-02-26 DIAGNOSIS — R6889 Other general symptoms and signs: Secondary | ICD-10-CM

## 2016-02-26 DIAGNOSIS — R110 Nausea: Secondary | ICD-10-CM | POA: Insufficient documentation

## 2016-02-26 DIAGNOSIS — R05 Cough: Secondary | ICD-10-CM | POA: Insufficient documentation

## 2016-02-26 DIAGNOSIS — R5383 Other fatigue: Secondary | ICD-10-CM | POA: Insufficient documentation

## 2016-02-26 DIAGNOSIS — F17210 Nicotine dependence, cigarettes, uncomplicated: Secondary | ICD-10-CM | POA: Insufficient documentation

## 2016-02-26 DIAGNOSIS — R0789 Other chest pain: Secondary | ICD-10-CM | POA: Insufficient documentation

## 2016-02-26 DIAGNOSIS — R6883 Chills (without fever): Secondary | ICD-10-CM | POA: Insufficient documentation

## 2016-02-26 HISTORY — DX: Anxiety disorder, unspecified: F41.9

## 2016-02-26 HISTORY — DX: Post-traumatic stress disorder, unspecified: F43.10

## 2016-02-26 MED ORDER — IBUPROFEN 800 MG PO TABS: 800.0000 mg | ORAL_TABLET | Freq: Three times a day (TID) | ORAL | Status: DC

## 2016-02-26 MED ORDER — ONDANSETRON 4 MG PO TBDP: 4.0000 mg | ORAL_TABLET | Freq: Three times a day (TID) | ORAL | Status: DC | PRN

## 2016-02-26 MED ORDER — ONDANSETRON 4 MG PO TBDP
4.0000 mg | ORAL_TABLET | Freq: Once | ORAL | Status: AC
  Administered 2016-02-26: 4 mg via ORAL
  Filled 2016-02-26: qty 1

## 2016-02-26 MED ORDER — IBUPROFEN 800 MG PO TABS
800.0000 mg | ORAL_TABLET | Freq: Once | ORAL | Status: AC
  Administered 2016-02-26: 800 mg via ORAL
  Filled 2016-02-26: qty 1

## 2016-02-26 NOTE — ED Notes (Signed)
Pt c/o generalized body aches, nausea, and chills x 4 days.  Pain score 8/10.  Denies emesis and diarrhea.  Pt reports taking Nyquil w/ relief, but sts "it's wearing off."
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
## 2016-02-26 NOTE — Discharge Instructions (Signed)
1. Medications: motrin for body aches, zofran for nausea, usual home medications 2. Treatment: rest, drink plenty of fluids 3. Follow Up: please followup with your primary doctor in 3-5 days for discussion of your diagnoses and further evaluation after today's visit; if you do not have a primary care doctor use the phone number listed in your discharge paperwork to find one; please return to the ER for high fever, shortness of breath, persistent vomiting, new or worsening symptoms

## 2016-12-28 ENCOUNTER — Encounter (HOSPITAL_COMMUNITY): Payer: Self-pay | Admitting: *Deleted

## 2016-12-28 ENCOUNTER — Inpatient Hospital Stay (HOSPITAL_COMMUNITY)
Admission: AD | Admit: 2016-12-28 | Discharge: 2016-12-28 | Disposition: A | Discharge status: Home / Self Care | Payer: Medicaid Other | Source: Ambulatory Visit | Attending: Obstetrics and Gynecology | Admitting: Obstetrics and Gynecology

## 2016-12-28 DIAGNOSIS — B9689 Other specified bacterial agents as the cause of diseases classified elsewhere: Secondary | ICD-10-CM | POA: Diagnosis not present

## 2016-12-28 DIAGNOSIS — Z79899 Other long term (current) drug therapy: Secondary | ICD-10-CM | POA: Diagnosis not present

## 2016-12-28 DIAGNOSIS — Z0001 Encounter for general adult medical examination with abnormal findings: Secondary | ICD-10-CM | POA: Diagnosis not present

## 2016-12-28 DIAGNOSIS — N760 Acute vaginitis: Secondary | ICD-10-CM | POA: Insufficient documentation

## 2016-12-28 DIAGNOSIS — Z113 Encounter for screening for infections with a predominantly sexual mode of transmission: Secondary | ICD-10-CM

## 2016-12-28 DIAGNOSIS — F17210 Nicotine dependence, cigarettes, uncomplicated: Secondary | ICD-10-CM | POA: Diagnosis not present

## 2016-12-28 LAB — WET PREP, GENITAL
Sperm: NONE SEEN
TRICH WET PREP: NONE SEEN
Yeast Wet Prep HPF POC: NONE SEEN

## 2016-12-28 LAB — HIV ANTIBODY (ROUTINE TESTING W REFLEX): HIV SCREEN 4TH GENERATION: NONREACTIVE

## 2016-12-28 LAB — POCT PREGNANCY, URINE: Preg Test, Ur: NEGATIVE

## 2016-12-28 MED ORDER — METRONIDAZOLE 500 MG PO TABS: 500.0000 mg | ORAL_TABLET | Freq: Two times a day (BID) | ORAL | 0 refills | Status: DC

## 2016-12-28 NOTE — MAU Note (Signed)
Asked my friend to ck down below and feels different. Feels like extra skin down there. Some white d/c without foul odor.

## 2016-12-28 NOTE — MAU Provider Note (Signed)
History     CSN: [id number]  Arrival date and time: 12/28/16 0121   First Provider Initiated Contact with Patient 12/28/16 0214      Chief Complaint  Patient presents with  . extra skin on vagina   Kendra Blake is a 36 y.o. G1P1001 presenting with a concern that there is an abnormality of her external genitalia, namely skin closing off her vagina. She states her female sexual partner noticed it and told her she looks different from the partner. She denies genital or vaginal irritation or itching. Discharge is white and non-irritative. She would like to be screened for sexually transmitted infection. States she had Trichomonas in the past. She had a vaginal delivery with perineal repair in 2006. [facility]    OB History  Gravida Para Term Preterm AB Living  1 1 1     1   SAB TAB Ectopic Multiple Live Births          1    # Outcome Date GA Lbr Len/2nd Weight Sex Delivery Anes PTL Lv  1 Term 06/27/05    Hannah BeatM    LIV       Past Medical History:  Diagnosis Date  . Anemia   . Anxiety   . PTSD (post-traumatic stress disorder)    Paps normal  Past Surgical History:  Procedure Laterality Date  . CARTILAGE SURGERY    . CHEST SURGERY    . LIGAMENT REPAIR      History reviewed. No pertinent family history.  Social History  Substance Use Topics  . Smoking status: Current Some Day Smoker    Packs/day: 0.00    Types: Cigarettes  . Smokeless tobacco: Never Used  . Alcohol use Yes     Comment: occasional    Allergies: No Known Allergies  Prescriptions Prior to Admission  Medication Sig Dispense Refill Last Dose  . guaifenesin (ROBITUSSIN) 100 MG/5ML syrup Take 100 mg by mouth 3 (three) times daily as needed for cough.   Past Week at Unknown time  . cephALEXin (KEFLEX) 500 MG capsule Take 1 capsule (500 mg total) by mouth 4 (four) times daily. 40 capsule 0   . Chlorpheniramine-DM (COUGH & COLD PO) Take 30 mLs by mouth every 6 (six) hours as needed (cold symptoms).    Past Month at Unknown time  . citalopram (CELEXA) 20 MG tablet Take 1 tablet (20 mg total) by mouth daily. (Patient not taking: Reported on 03/20/2015) 30 tablet 0 Completed Course at Unknown time  . ibuprofen (ADVIL,MOTRIN) 800 MG tablet Take 1 tablet (800 mg total) by mouth 3 (three) times daily. 21 tablet 0   . LORazepam (ATIVAN) 1 MG tablet Take 1 tablet (1 mg total) by mouth every 8 (eight) hours as needed for anxiety. (Patient not taking: Reported on 03/20/2015) 30 tablet 0 Completed Course at Unknown time  . ondansetron (ZOFRAN ODT) 4 MG disintegrating tablet Take 1 tablet (4 mg total) by mouth every 8 (eight) hours as needed for nausea. 10 tablet 0   . prazosin (MINIPRESS) 2 MG capsule Take 1 capsule (2 mg total) by mouth at bedtime. (Patient not taking: Reported on 03/20/2015) 30 capsule 0 Completed Course at Unknown time  . QUEtiapine (SEROQUEL) 25 MG tablet Take 1 tablet (25 mg total) by mouth 2 (two) times daily. (Patient not taking: Reported on 03/20/2015) 60 tablet 0 Completed Course at Unknown time  . traMADol (ULTRAM) 50 MG tablet Take 1 tablet (50 mg total) by mouth every 6 (  six) hours as needed. 15 tablet 0   . traZODone (DESYREL) 50 MG tablet Take 1 tablet (50 mg total) by mouth at bedtime as needed and may repeat dose one time if needed for sleep. (Patient not taking: Reported on 03/20/2015) 30 tablet 0 Completed Course at Unknown time    Review of Systems  Gastrointestinal: Negative for abdominal pain, constipation, diarrhea, nausea and vomiting.  Genitourinary: Negative for dyspareunia, dysuria, frequency, genital sores, pelvic pain, urgency, vaginal bleeding, vaginal discharge and vaginal pain.   Physical Exam   Blood pressure 123/75, pulse 89, temperature 98.6 F (37 C), resp. rate 18, height 5' 5.5" (1.664 m), weight 60.1 kg (132 lb 9.6 oz), last menstrual period 11/15/2016.  Physical Exam  Nursing note and vitals reviewed. Constitutional: She is oriented to person, place,  and time. She appears well-nourished. No distress.  HENT:  Head: Normocephalic.  Neck: Normal range of motion.  Cardiovascular: Normal rate.   Respiratory: Effort normal.  GI: Soft. There is no tenderness.  Genitourinary:  Genitourinary Comments: NEFG. At forchette there is a thin ridge where labia minora meet distally (which she indicates is her concern) Spec: homogenous thin white discharge. Cx clean Bimanual Uterus NSSP, no adnexal tenderness or masses; no CMT   Musculoskeletal: Normal range of motion.  Neurological: She is alert and oriented to person, place, and time.  Skin: Skin is warm and dry.    MAU Course  Procedures Results for orders placed or performed during the hospital encounter of 12/28/16 (from the past 24 hour(s))  Pregnancy, urine POC     Status: None   Collection Time: 12/28/16  2:09 AM  Result Value Ref Range   Preg Test, Ur NEGATIVE NEGATIVE  Wet prep, genital     Status: Abnormal   Collection Time: 12/28/16  2:36 AM  Result Value Ref Range   Yeast Wet Prep HPF POC NONE SEEN NONE SEEN   Trich, Wet Prep NONE SEEN NONE SEEN   Clue Cells Wet Prep HPF POC PRESENT (A) NONE SEEN   WBC, Wet Prep HPF POC FEW (A) NONE SEEN   Sperm NONE SEEN    GC,CT, HIV sent  Assessment and Plan   1. Screening for STD (sexually transmitted disease)   2. BV (bacterial vaginosis)    Discharge home with reassurance NEFG  Smoking cessation information given Allergies as of 12/28/2016   No Known Allergies     Medication List    STOP taking these medications   cephALEXin 500 MG capsule Commonly known as:  KEFLEX   citalopram 20 MG tablet Commonly known as:  CELEXA   COUGH & COLD PO   guaifenesin 100 MG/5ML syrup Commonly known as:  ROBITUSSIN   ibuprofen 800 MG tablet Commonly known as:  ADVIL,MOTRIN   LORazepam 1 MG tablet Commonly known as:  ATIVAN   ondansetron 4 MG disintegrating tablet Commonly known as:  ZOFRAN ODT   prazosin 2 MG capsule Commonly  known as:  MINIPRESS   QUEtiapine 25 MG tablet Commonly known as:  SEROQUEL   traMADol 50 MG tablet Commonly known as:  ULTRAM   traZODone 50 MG tablet Commonly known as:  DESYREL     TAKE these medications   metroNIDAZOLE 500 MG tablet Commonly known as:  FLAGYL Take 1 tablet (500 mg total) by mouth 2 (two) times daily.      Follow-up Information    [facility]. Schedule an appointment as soon as possible for a visit in 1 month(s).  Contact information: [address] [place] [address] [phone]           Nguyet Mercer 12/28/2016, 2:19 AM

## 2016-12-28 NOTE — Discharge Instructions (Signed)
Bacterial Vaginosis Bacterial vaginosis is a vaginal infection that occurs when the normal balance of bacteria in the vagina is disrupted. It results from an overgrowth of certain bacteria. This is the most common vaginal infection among women ages 55-44. Because bacterial vaginosis increases your risk for STIs (sexually transmitted infections), getting treated can help reduce your risk for chlamydia, gonorrhea, herpes, and HIV (human immunodeficiency virus). Treatment is also important for preventing complications in pregnant women, because this condition can cause an early (premature) delivery. What are the causes? This condition is caused by an increase in harmful bacteria that are normally present in small amounts in the vagina. However, the reason that the condition develops is not fully understood. What increases the risk? The following factors may make you more likely to develop this condition:  Having a new sexual partner or multiple sexual partners.  Having unprotected sex.  Douching.  Having an intrauterine device (IUD).  Smoking.  Drug and alcohol abuse.  Taking certain antibiotic medicines.  Being pregnant. You cannot get bacterial vaginosis from toilet seats, bedding, swimming pools, or contact with objects around you. What are the signs or symptoms? Symptoms of this condition include:  Grey or white vaginal discharge. The discharge can also be watery or foamy.  A fish-like odor with discharge, especially after sexual intercourse or during menstruation.  Itching in and around the vagina.  Burning or pain with urination. Some women with bacterial vaginosis have no signs or symptoms. How is this diagnosed? This condition is diagnosed based on:  Your medical history.  A physical exam of the vagina.  Testing a sample of vaginal fluid under a microscope to look for a large amount of bad bacteria or abnormal cells. Your health care provider may use a cotton swab or a  small wooden spatula to collect the sample. How is this treated? This condition is treated with antibiotics. These may be given as a pill, a vaginal cream, or a medicine that is put into the vagina (suppository). If the condition comes back after treatment, a second round of antibiotics may be needed. Follow these instructions at home: Medicines  Take over-the-counter and prescription medicines only as told by your health care provider.  Take or use your antibiotic as told by your health care provider. Do not stop taking or using the antibiotic even if you start to feel better. General instructions  If you have a female sexual partner, tell her that you have a vaginal infection. She should see her health care provider and be treated if she has symptoms. If you have a female sexual partner, he does not need treatment.  During treatment:  Avoid sexual activity until you finish treatment.  Do not douche.  Avoid alcohol as directed by your health care provider.  Avoid breastfeeding as directed by your health care provider.  Drink enough water and fluids to keep your urine clear or pale yellow.  Keep the area around your vagina and rectum clean.  Wash the area daily with warm water.  Wipe yourself from front to back after using the toilet.  Keep all follow-up visits as told by your health care provider. This is important. How is this prevented?  Do not douche.  Wash the outside of your vagina with warm water only.  Use protection when having sex. This includes latex condoms and dental dams.  Limit how many sexual partners you have. To help prevent bacterial vaginosis, it is best to have sex with just one partner (monogamous).  Make sure you and your sexual partner are tested for STIs.  Wear cotton or cotton-lined underwear.  Avoid wearing tight pants and pantyhose, especially during summer.  Limit the amount of alcohol that you drink.  Do not use any products that contain  nicotine or tobacco, such as cigarettes and e-cigarettes. If you need help quitting, ask your health care provider.  Do not use illegal drugs. Where to find more information:  Centers for Disease Control and Prevention: SolutionApps.co.zawww.cdc.gov/std  American Sexual Health Association (ASHA): www.ashastd.org  U.S. Department of Health and Health and safety inspectorHuman Services, Office on Women's Health: ConventionalMedicines.siwww.womenshealth.gov/ or http://www.anderson-williamson.info/https://www.womenshealth.gov/a-z-topics/bacterial-vaginosis Contact a health care provider if:  Your symptoms do not improve, even after treatment.  You have more discharge or pain when urinating.  You have a fever.  You have pain in your abdomen.  You have pain during sex.  You have vaginal bleeding between periods. Summary  Bacterial vaginosis is a vaginal infection that occurs when the normal balance of bacteria in the vagina is disrupted.  Because bacterial vaginosis increases your risk for STIs (sexually transmitted infections), getting treated can help reduce your risk for chlamydia, gonorrhea, herpes, and HIV (human immunodeficiency virus). Treatment is also important for preventing complications in pregnant women, because the condition can cause an early (premature) delivery.  This condition is treated with antibiotic medicines. These may be given as a pill, a vaginal cream, or a medicine that is put into the vagina (suppository). This information is not intended to replace advice given to you by your health care provider. Make sure you discuss any questions you have with your health care provider. Document Released: 11/25/2005 Document Revised: 08/10/2016 Document Reviewed: 08/10/2016 Elsevier Interactive Patient Education  2017 [employer] Steps to Quit Smoking Smoking tobacco can be harmful to your health and can affect almost every organ in your body. Smoking puts you, and those around you, at risk for developing many serious chronic diseases. Quitting smoking is difficult, but it  is one of the best things that you can do for your health. It is never too late to quit. What are the benefits of quitting smoking? When you quit smoking, you lower your risk of developing serious diseases and conditions, such as:  Lung cancer or lung disease, such as COPD.  Heart disease.  Stroke.  Heart attack.  Infertility.  Osteoporosis and bone fractures. Additionally, symptoms such as coughing, wheezing, and shortness of breath may get better when you quit. You may also find that you get sick less often because your body is stronger at fighting off colds and infections. If you are pregnant, quitting smoking can help to reduce your chances of having a baby of low birth weight. How do I get ready to quit? When you decide to quit smoking, create a plan to make sure that you are successful. Before you quit:  Pick a date to quit. Set a date within the next two weeks to give you time to prepare.  Write down the reasons why you are quitting. Keep this list in places where you will see it often, such as on your bathroom mirror or in your car or wallet.  Identify the people, places, things, and activities that make you want to smoke (triggers) and avoid them. Make sure to take these actions:  Throw away all cigarettes at home, at work, and in your car.  Throw away smoking accessories, such as Set designerashtrays and lighters.  Clean your car and make sure to empty the ashtray.  Clean  your home, including curtains and carpets.  Tell your family, friends, and coworkers that you are quitting. Support from your loved ones can make quitting easier.  Talk with your health care provider about your options for quitting smoking.  Find out what treatment options are covered by your health insurance. What strategies can I use to quit smoking? Talk with your healthcare provider about different strategies to quit smoking. Some strategies include:  Quitting smoking altogether instead of gradually  lessening how much you smoke over a period of time. Research shows that quitting cold [place] is more successful than gradually quitting.  Attending in-person counseling to help you build problem-solving skills. You are more likely to have success in quitting if you attend several counseling sessions. Even short sessions of 10 minutes can be effective.  Finding resources and support systems that can help you to quit smoking and remain smoke-free after you quit. These resources are most helpful when you use them often. They can include:  Online chats with a Veterinary surgeon.  Telephone quitlines.  Printed Materials engineer.  Support groups or group counseling.  Text messaging programs.  Mobile phone applications.  Taking medicines to help you quit smoking. (If you are pregnant or breastfeeding, talk with your health care provider first.) Some medicines contain nicotine and some do not. Both types of medicines help with cravings, but the medicines that include nicotine help to relieve withdrawal symptoms. Your health care provider may recommend:  Nicotine patches, gum, or lozenges.  Nicotine inhalers or sprays.  Non-nicotine medicine that is taken by mouth. Talk with your health care provider about combining strategies, such as taking medicines while you are also receiving in-person counseling. Using these two strategies together makes you more likely to succeed in quitting than if you used either strategy on its own. If you are pregnant or breastfeeding, talk with your health care provider about finding counseling or other support strategies to quit smoking. Do not take medicine to help you quit smoking unless told to do so by your health care provider. What things can I do to make it easier to quit? Quitting smoking might feel overwhelming at first, but there is a lot that you can do to make it easier. Take these important actions:  Reach out to your family and friends and ask that they  support and encourage you during this time. Call telephone quitlines, reach out to support groups, or work with a counselor for support.  Ask people who smoke to avoid smoking around you.  Avoid places that trigger you to smoke, such as bars, parties, or smoke-break areas at work.  Spend time around people who do not smoke.  Lessen stress in your life, because stress can be a smoking trigger for some people. To lessen stress, try:  Exercising regularly.  Deep-breathing exercises.  Yoga.  Meditating.  Performing a body scan. This involves closing your eyes, scanning your body from head to toe, and noticing which parts of your body are particularly tense. Purposefully relax the muscles in those areas.  Download or purchase mobile phone or tablet apps (applications) that can help you stick to your quit plan by providing reminders, tips, and encouragement. There are many free apps, such as QuitGuide from the [employer] developer for Disease Control and Prevention). You can find other support for quitting smoking (smoking cessation) through smokefree.gov and other websites. How will I feel when I quit smoking? Within the first 24 hours of quitting smoking, you may start to feel some  withdrawal symptoms. These symptoms are usually most noticeable 2-3 days after quitting, but they usually do not last beyond 2-3 weeks. Changes or symptoms that you might experience include:  Mood swings.  Restlessness, anxiety, or irritation.  Difficulty concentrating.  Dizziness.  Strong cravings for sugary foods in addition to nicotine.  Mild weight gain.  Constipation.  Nausea.  Coughing or a sore throat.  Changes in how your medicines work in your body.  A depressed mood.  Difficulty sleeping (insomnia). After the first 2-3 weeks of quitting, you may start to notice more positive results, such as:  Improved sense of smell and taste.  Decreased coughing and sore throat.  Slower heart  rate.  Lower blood pressure.  Clearer skin.  The ability to breathe more easily.  Fewer sick days. Quitting smoking is very challenging for most people. Do not get discouraged if you are not successful the first time. Some people need to make many attempts to quit before they achieve long-term success. Do your best to stick to your quit plan, and talk with your health care provider if you have any questions or concerns. This information is not intended to replace advice given to you by your health care provider. Make sure you discuss any questions you have with your health care provider. Document Released: 11/19/2001 Document Revised: 07/23/2016 Document Reviewed: 04/11/2015 Elsevier Interactive Patient Education  2017 [employer].

## 2016-12-30 LAB — GC/CHLAMYDIA PROBE AMP (~~LOC~~) NOT AT ARMC
CHLAMYDIA, DNA PROBE: NEGATIVE
Neisseria Gonorrhea: NEGATIVE

## 2018-03-02 ENCOUNTER — Emergency Department (HOSPITAL_COMMUNITY)
Admission: EM | Admit: 2018-03-02 | Discharge: 2018-03-02 | Disposition: A | Discharge status: Home / Self Care | Payer: Self-pay | Attending: Emergency Medicine | Admitting: Emergency Medicine

## 2018-03-02 ENCOUNTER — Encounter (HOSPITAL_COMMUNITY): Payer: Self-pay

## 2018-03-02 ENCOUNTER — Other Ambulatory Visit: Payer: Self-pay

## 2018-03-02 DIAGNOSIS — Y9389 Activity, other specified: Secondary | ICD-10-CM | POA: Insufficient documentation

## 2018-03-02 DIAGNOSIS — Y999 Unspecified external cause status: Secondary | ICD-10-CM | POA: Insufficient documentation

## 2018-03-02 DIAGNOSIS — Z79899 Other long term (current) drug therapy: Secondary | ICD-10-CM | POA: Insufficient documentation

## 2018-03-02 DIAGNOSIS — S61212A Laceration without foreign body of right middle finger without damage to nail, initial encounter: Secondary | ICD-10-CM | POA: Insufficient documentation

## 2018-03-02 DIAGNOSIS — Y929 Unspecified place or not applicable: Secondary | ICD-10-CM | POA: Insufficient documentation

## 2018-03-02 DIAGNOSIS — F17210 Nicotine dependence, cigarettes, uncomplicated: Secondary | ICD-10-CM | POA: Insufficient documentation

## 2018-03-02 DIAGNOSIS — W260XXA Contact with knife, initial encounter: Secondary | ICD-10-CM | POA: Insufficient documentation

## 2018-03-02 MED ORDER — LIDOCAINE HCL (PF) 1 % IJ SOLN
5.0000 mL | Freq: Once | INTRAMUSCULAR | Status: AC
  Administered 2018-03-02: 5 mL
  Filled 2018-03-02: qty 30

## 2018-03-02 MED ORDER — IBUPROFEN 600 MG PO TABS: 600.0000 mg | ORAL_TABLET | Freq: Four times a day (QID) | ORAL | 0 refills | Status: DC | PRN

## 2018-03-02 MED ORDER — BACITRACIN ZINC 500 UNIT/GM EX OINT
TOPICAL_OINTMENT | Freq: Once | CUTANEOUS | Status: AC
  Administered 2018-03-02: 1 via TOPICAL

## 2018-03-02 NOTE — ED Provider Notes (Signed)
[facility]-EMERGENCY DEPT Provider Note   CSN: [id number] Arrival date & time: 03/02/18  1526     History   Chief Complaint Chief Complaint  Patient presents with  . Finger Injury    HPI Kendra Blake is a 37 y.o. female.  The history is provided by the patient and medical records. No language interpreter was used.   Kendra Blake is a 37 y.o. female who presents to the Emergency Department complaining of laceration to the right middle finger which occurred a couple of hours ago.  Patient states that she was reaching for a knife in the sink with a rag when the rack slipped, causing the knife to cut her.  Finger continued to bleed, prompting her to come to the emergency department.  She denies any numbness or weakness.  No medications taken prior to arrival for symptoms.  She did rinse the finger with warm water under the sink.Tetanus up-to-date.   Past Medical History:  Diagnosis Date  . Anemia   . Anxiety   . PTSD (post-traumatic stress disorder)     Patient Active Problem List   Diagnosis Date Noted  . Post traumatic stress disorder (PTSD) 07/02/2014    Past Surgical History:  Procedure Laterality Date  . CARTILAGE SURGERY    . CHEST SURGERY    . LIGAMENT REPAIR       OB History    Gravida  1   Para  1   Term  1   Preterm      AB      Living  1     SAB      TAB      Ectopic      Multiple      Live Births  1            Home Medications    Prior to Admission medications   Medication Sig Start Date End Date Taking? Authorizing Provider  ibuprofen (ADVIL,MOTRIN) 600 MG tablet Take 1 tablet (600 mg total) by mouth every 6 (six) hours as needed. 03/02/18   Laden Fieldhouse, Chase PicketJaime Pilcher, PA-C  metroNIDAZOLE (FLAGYL) 500 MG tablet Take 1 tablet (500 mg total) by mouth 2 (two) times daily. 12/28/16   Poe, Deirdre Salena Saner, CNM    Family History History reviewed. No pertinent family history.  Social History Social History   Tobacco Use    . Smoking status: Current Some Day Smoker    Packs/day: 0.00    Types: Cigarettes  . Smokeless tobacco: Never Used  Substance Use Topics  . Alcohol use: Yes    Comment: occasional  . Drug use: No     Allergies   Patient has no known allergies.   Review of Systems Review of Systems  Musculoskeletal: Positive for myalgias.  Skin: Positive for wound.  Neurological: Negative for weakness and numbness.     Physical Exam Updated Vital Signs BP 114/66 (BP Location: Left Arm)   Pulse 90   Temp 98 F (36.7 C) (Oral)   Resp 18   Ht 5' 5.5" (1.664 m)   Wt 62.6 kg (138 lb)   LMP 02/26/2018   SpO2 97%   BMI 22.62 kg/m   Physical Exam  Constitutional: She appears well-developed and well-nourished. No distress.  HENT:  Head: Normocephalic and atraumatic.  Neck: Neck supple.  Cardiovascular: Normal rate, regular rhythm and normal heart sounds.  No murmur heard. Pulmonary/Chest: Effort normal and breath sounds normal. No respiratory distress. She has no wheezes. She  has no rales.  Musculoskeletal:  Full range of motion of the right hand.  Neurological: She is alert.  Skin: Skin is warm and dry.  2 cm laceration to palmar aspect of distal right middle finger.  Good cap refill.  Sensation intact.  Nursing note and vitals reviewed.    ED Treatments / Results  Labs (all labs ordered are listed, but only abnormal results are displayed) Labs Reviewed - No data to display  EKG None  Radiology No results found.  Procedures .Marland KitchenLaceration Repair Date/Time: 03/02/2018 5:16 PM Performed by: Beryl Balz, Chase Picket, PA-C Authorized by: Mani Celestin, Chase Picket, PA-C   Consent:    Consent obtained:  Verbal   Consent given by:  Patient   Risks discussed:  Pain, infection, poor cosmetic result and poor wound healing Anesthesia (see MAR for exact dosages):    Anesthesia method:  Local infiltration   Local anesthetic:  Lidocaine 1% w/o epi Laceration details:    Location:  Finger    Finger location:  R long finger   Length (cm):  2 Repair type:    Repair type:  Simple Pre-procedure details:    Preparation:  Patient was prepped and draped in usual sterile fashion Exploration:    Hemostasis achieved with:  Direct pressure   Wound exploration: wound explored through full range of motion and entire depth of wound probed and visualized   Treatment:    Area cleansed with:  Saline and Betadine   Amount of cleaning:  Standard   Irrigation solution:  Sterile saline Skin repair:    Repair method:  Sutures   Suture size:  5-0   Wound skin closure material used: Vicryl rapide.   Suture technique:  Simple interrupted   Number of sutures:  2 Approximation:    Approximation:  Close Post-procedure details:    Dressing:  Antibiotic ointment and non-adherent dressing   Patient tolerance of procedure:  Tolerated well, no immediate complications   (including critical care time)  Medications Ordered in ED Medications  lidocaine (PF) (XYLOCAINE) 1 % injection 5 mL (has no administration in time range)  bacitracin ointment (has no administration in time range)     Initial Impression / Assessment and Plan / ED Course  I have reviewed the triage vital signs and the nursing notes.  Pertinent labs & imaging results that were available during my care of the patient were reviewed by me and considered in my medical decision making (see chart for details).    Kendra Blake is a 37 y.o. female who presents to ED for superficial laceration of right middle finger today. Wound thoroughly cleaned in ED today. Wound explored and bottom of wound seen in a bloodless field. Laceration repaired as dictated above. Patient counseled on home wound care. Patient was urged to return to the Emergency Department for worsening pain, swelling, expanding erythema especially if it streaks away from the affected area, fever, or for any additional concerns. Patient verbalized understanding. All questions  answered.   Final Clinical Impressions(s) / ED Diagnoses   Final diagnoses:  Laceration of right middle finger without foreign body without damage to nail, initial encounter    ED Discharge Orders        Ordered    ibuprofen (ADVIL,MOTRIN) 600 MG tablet  Every 6 hours PRN     03/02/18 1714       Kataleena Holsapple, Chase Picket, PA-C 03/02/18 1718    Rolan Bucco, MD 03/02/18 1733

## 2018-03-02 NOTE — ED Triage Notes (Signed)
Patient presents with right middle finger laceration. Patient states she was reaching for a rag and her hand slipped and the side of her finger was cut by a knife. Patient reports this occurred "a couple of hours ago" but states the finger continues to hurt and bleed. Bleeding controlled in triage.

## 2018-03-02 NOTE — Discharge Instructions (Signed)
It was my pleasure taking care of you today!   Keep wound clean with mild soap and water. Keep area covered with a topical antibiotic ointment and bandage, keep bandage dry, and do not submerge in water today. Ice and elevate for additional pain relief and swelling. Alternate between ibuprofen and Tylenol for additional pain relief. The stitches placed today are dissolvable. They should dissolve in 1 week. If they do not, place Vaseline to the area and rub with a warm washrag to help get them out. You can also return to ER, see primary doctor or an urgent care for suture removal if you would prefer.  Monitor area for signs of infection to include, but not limited to: increasing pain, spreading redness, drainage/pus, worsening swelling, or fevers. Return to emergency department for emergent changing or worsening symptoms.

## 2018-05-15 ENCOUNTER — Emergency Department (HOSPITAL_COMMUNITY)
Admission: EM | Admit: 2018-05-15 | Discharge: 2018-05-15 | Disposition: A | Discharge status: Home / Self Care | Payer: Self-pay | Attending: Emergency Medicine | Admitting: Emergency Medicine

## 2018-05-15 ENCOUNTER — Other Ambulatory Visit: Payer: Self-pay

## 2018-05-15 ENCOUNTER — Emergency Department (HOSPITAL_COMMUNITY): Payer: Self-pay

## 2018-05-15 ENCOUNTER — Encounter (HOSPITAL_COMMUNITY): Payer: Self-pay

## 2018-05-15 DIAGNOSIS — Y999 Unspecified external cause status: Secondary | ICD-10-CM | POA: Insufficient documentation

## 2018-05-15 DIAGNOSIS — M25562 Pain in left knee: Secondary | ICD-10-CM | POA: Insufficient documentation

## 2018-05-15 DIAGNOSIS — S60551A Superficial foreign body of right hand, initial encounter: Secondary | ICD-10-CM | POA: Insufficient documentation

## 2018-05-15 DIAGNOSIS — F17210 Nicotine dependence, cigarettes, uncomplicated: Secondary | ICD-10-CM | POA: Insufficient documentation

## 2018-05-15 DIAGNOSIS — R0789 Other chest pain: Secondary | ICD-10-CM | POA: Insufficient documentation

## 2018-05-15 DIAGNOSIS — Y939 Activity, unspecified: Secondary | ICD-10-CM | POA: Insufficient documentation

## 2018-05-15 DIAGNOSIS — M25512 Pain in left shoulder: Secondary | ICD-10-CM | POA: Insufficient documentation

## 2018-05-15 DIAGNOSIS — M25561 Pain in right knee: Secondary | ICD-10-CM | POA: Insufficient documentation

## 2018-05-15 DIAGNOSIS — S60811A Abrasion of right wrist, initial encounter: Secondary | ICD-10-CM | POA: Insufficient documentation

## 2018-05-15 DIAGNOSIS — S60511A Abrasion of right hand, initial encounter: Secondary | ICD-10-CM

## 2018-05-15 DIAGNOSIS — M79641 Pain in right hand: Secondary | ICD-10-CM

## 2018-05-15 DIAGNOSIS — Y929 Unspecified place or not applicable: Secondary | ICD-10-CM | POA: Insufficient documentation

## 2018-05-15 DIAGNOSIS — R1084 Generalized abdominal pain: Secondary | ICD-10-CM | POA: Insufficient documentation

## 2018-05-15 LAB — CBC WITH DIFFERENTIAL/PLATELET
BASOS ABS: 0 10*3/uL (ref 0.0–0.1)
Basophils Relative: 0 %
EOS PCT: 3 %
Eosinophils Absolute: 0.2 10*3/uL (ref 0.0–0.7)
HEMATOCRIT: 31 % — AB (ref 36.0–46.0)
HEMOGLOBIN: 9.9 g/dL — AB (ref 12.0–15.0)
LYMPHS ABS: 2.4 10*3/uL (ref 0.7–4.0)
LYMPHS PCT: 34 %
MCH: 26.6 pg (ref 26.0–34.0)
MCHC: 31.9 g/dL (ref 30.0–36.0)
MCV: 83.3 fL (ref 78.0–100.0)
Monocytes Absolute: 0.6 10*3/uL (ref 0.1–1.0)
Monocytes Relative: 9 %
NEUTROS ABS: 3.9 10*3/uL (ref 1.7–7.7)
NEUTROS PCT: 54 %
PLATELETS: 229 10*3/uL (ref 150–400)
RBC: 3.72 MIL/uL — AB (ref 3.87–5.11)
RDW: 18.2 % — ABNORMAL HIGH (ref 11.5–15.5)
WBC: 7.2 10*3/uL (ref 4.0–10.5)

## 2018-05-15 LAB — COMPREHENSIVE METABOLIC PANEL
ALK PHOS: 54 U/L (ref 38–126)
ALT: 23 U/L (ref 14–54)
AST: 41 U/L (ref 15–41)
Albumin: 4.1 g/dL (ref 3.5–5.0)
Anion gap: 8 (ref 5–15)
BILIRUBIN TOTAL: 0.6 mg/dL (ref 0.3–1.2)
BUN: 21 mg/dL — AB (ref 6–20)
CALCIUM: 9.7 mg/dL (ref 8.9–10.3)
CHLORIDE: 109 mmol/L (ref 101–111)
CO2: 28 mmol/L (ref 22–32)
CREATININE: 0.8 mg/dL (ref 0.44–1.00)
GFR calc non Af Amer: >60 mL/min (ref >60)
Glucose, Bld: 65 mg/dL (ref 65–99)
Potassium: 3.9 mmol/L (ref 3.5–5.1)
Sodium: 145 mmol/L (ref 135–145)
Total Protein: 7.5 g/dL (ref 6.5–8.1)

## 2018-05-15 LAB — URINALYSIS, ROUTINE W REFLEX MICROSCOPIC
BILIRUBIN URINE: NEGATIVE
GLUCOSE, UA: NEGATIVE mg/dL
HGB URINE DIPSTICK: NEGATIVE
Ketones, ur: NEGATIVE mg/dL
Leukocytes, UA: NEGATIVE
Nitrite: NEGATIVE
PROTEIN: NEGATIVE mg/dL
Specific Gravity, Urine: 1.021 (ref 1.005–1.030)
pH: 6 (ref 5.0–8.0)

## 2018-05-15 LAB — I-STAT BETA HCG BLOOD, ED (MC, WL, AP ONLY): I-stat hCG, quantitative: <5 m[IU]/mL (ref <5)

## 2018-05-15 LAB — LIPASE, BLOOD: Lipase: 32 U/L (ref 11–51)

## 2018-05-15 MED ORDER — NAPROXEN 500 MG PO TABS: 500.0000 mg | ORAL_TABLET | Freq: Two times a day (BID) | ORAL | 0 refills | Status: DC

## 2018-05-15 MED ORDER — METHOCARBAMOL 500 MG PO TABS
1000.0000 mg | ORAL_TABLET | Freq: Once | ORAL | Status: AC
  Administered 2018-05-15: 1000 mg via ORAL
  Filled 2018-05-15: qty 2

## 2018-05-15 MED ORDER — OXYCODONE-ACETAMINOPHEN 5-325 MG PO TABS: 1.0000 | ORAL_TABLET | Freq: Four times a day (QID) | ORAL | 0 refills | Status: DC | PRN

## 2018-05-15 MED ORDER — OXYCODONE-ACETAMINOPHEN 5-325 MG PO TABS
1.0000 | ORAL_TABLET | Freq: Once | ORAL | Status: AC
  Administered 2018-05-15: 1 via ORAL
  Filled 2018-05-15: qty 1

## 2018-05-15 MED ORDER — METHOCARBAMOL 500 MG PO TABS: 500.0000 mg | ORAL_TABLET | Freq: Two times a day (BID) | ORAL | 0 refills | Status: DC

## 2018-05-15 MED ORDER — BACITRACIN ZINC 500 UNIT/GM EX OINT
TOPICAL_OINTMENT | Freq: Once | CUTANEOUS | Status: AC
  Administered 2018-05-15: 1 via TOPICAL
  Filled 2018-05-15: qty 1.8

## 2018-05-15 MED ORDER — IOPAMIDOL (ISOVUE-300) INJECTION 61%
INTRAVENOUS | Status: AC
  Filled 2018-05-15: qty 100

## 2018-05-15 MED ORDER — IOPAMIDOL (ISOVUE-300) INJECTION 61%
100.0000 mL | Freq: Once | INTRAVENOUS | Status: AC | PRN
  Administered 2018-05-15: 100 mL via INTRAVENOUS

## 2018-05-15 NOTE — ED Notes (Signed)
Bed: WA25 Expected date:  Expected time:  Means of arrival:  Comments: Triage pt 

## 2018-05-15 NOTE — ED Notes (Signed)
Patient ambulated to restroom with one person assist.  
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
## 2018-05-15 NOTE — ED Notes (Signed)
Bed: WTR5 Expected date:  Expected time:  Means of arrival:  Comments: 
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
## 2018-05-15 NOTE — Discharge Instructions (Signed)
Your imaging is very reassuring, CTs of the chest and abdomen did not show any evidence of injury to the chest or abdominal organs, x-rays of your shoulder and knee show no evidence of fracture, you may use knee sleeve on the left to help with pain and swelling, ice and elevate these areas as much as possible and if pain persist you will need to follow-up with orthopedics.  X-ray of her hand shows no evidence of fracture, superficial abrasions with 2 very small superficial foreign bodies, likely glass, these likely came out with irrigation and if not you can continue to irrigate and clean the hand if you see signs of infection such as redness, warmth, swelling or drainage or any fevers these are signs of infection and you should return for reevaluation.  Please use Naprosyn, Robaxin and Percocet for pain, these medications can cause drowsiness and you should not use them while driving or operating machinery, do not combine with alcohol.  Muscle soreness should slowly improve over the next week, use ice and heating as well and follow-up with your primary doctor.
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
## 2018-05-15 NOTE — ED Notes (Signed)
Patient reported to EDPa that she was having chest pain and abdominal pain. Acuity increased to Acuity 3.

## 2018-05-28 ENCOUNTER — Ambulatory Visit: Payer: Self-pay | Attending: Family Medicine | Admitting: Physician Assistant

## 2018-05-28 VITALS — BP 117/86 | HR 111 | Temp 98.2°F | Wt 132.4 lb

## 2018-05-28 DIAGNOSIS — M25562 Pain in left knee: Secondary | ICD-10-CM | POA: Insufficient documentation

## 2018-05-28 DIAGNOSIS — D649 Anemia, unspecified: Secondary | ICD-10-CM | POA: Insufficient documentation

## 2018-05-28 DIAGNOSIS — R0789 Other chest pain: Secondary | ICD-10-CM | POA: Insufficient documentation

## 2018-05-28 DIAGNOSIS — M25561 Pain in right knee: Secondary | ICD-10-CM | POA: Insufficient documentation

## 2018-05-28 DIAGNOSIS — F17200 Nicotine dependence, unspecified, uncomplicated: Secondary | ICD-10-CM | POA: Insufficient documentation

## 2018-05-28 DIAGNOSIS — F4310 Post-traumatic stress disorder, unspecified: Secondary | ICD-10-CM | POA: Insufficient documentation

## 2018-05-28 MED ORDER — METHOCARBAMOL 500 MG PO TABS: 500.0000 mg | ORAL_TABLET | Freq: Two times a day (BID) | ORAL | 0 refills | Status: DC

## 2018-05-28 MED ORDER — NAPROXEN 500 MG PO TABS: 500.0000 mg | ORAL_TABLET | Freq: Two times a day (BID) | ORAL | 0 refills | Status: DC
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
## 2018-06-04 ENCOUNTER — Ambulatory Visit (HOSPITAL_COMMUNITY): Payer: No Typology Code available for payment source

## 2018-06-10 ENCOUNTER — Ambulatory Visit (HOSPITAL_COMMUNITY)
Admission: RE | Admit: 2018-06-10 | Discharge: 2018-06-10 | Disposition: A | Discharge status: Home / Self Care | Payer: No Typology Code available for payment source | Source: Ambulatory Visit | Attending: Physician Assistant | Admitting: Physician Assistant

## 2018-06-10 DIAGNOSIS — M25562 Pain in left knee: Secondary | ICD-10-CM | POA: Diagnosis not present

## 2018-06-17 ENCOUNTER — Telehealth: Payer: Self-pay | Admitting: General Practice

## 2018-06-17 NOTE — Telephone Encounter (Signed)
Dr. Alvis LemmingsNewlin, pt was seen by Tiffany.Would you release her results so that I may inform her?

## 2018-06-17 NOTE — Telephone Encounter (Signed)
Patient called requesting her MRI results. Please f/u with patient.

## 2018-06-17 NOTE — Telephone Encounter (Signed)
MRI revealed no acute abnormality of the knee.  I have sent the patient a my chart message as well.

## 2018-06-17 NOTE — Telephone Encounter (Signed)
Unable to reach patient. Voicemail full, unable to leave message for return call.

## 2018-06-19 NOTE — Telephone Encounter (Signed)
Pt name and DOB verified. Pt aware of results of MRI.

## 2018-06-29 ENCOUNTER — Ambulatory Visit: Payer: Self-pay | Admitting: Nurse Practitioner

## 2018-07-14 ENCOUNTER — Ambulatory Visit: Payer: Self-pay | Admitting: Family Medicine

## 2018-10-13 ENCOUNTER — Encounter (HOSPITAL_COMMUNITY): Payer: Self-pay | Admitting: Emergency Medicine

## 2018-10-13 ENCOUNTER — Emergency Department (HOSPITAL_COMMUNITY)
Admission: EM | Admit: 2018-10-13 | Discharge: 2018-10-13 | Disposition: A | Discharge status: Home / Self Care | Payer: Medicaid Other | Attending: Emergency Medicine | Admitting: Emergency Medicine

## 2018-10-13 DIAGNOSIS — Z5321 Procedure and treatment not carried out due to patient leaving prior to being seen by health care provider: Secondary | ICD-10-CM | POA: Insufficient documentation

## 2018-10-13 DIAGNOSIS — M79606 Pain in leg, unspecified: Secondary | ICD-10-CM | POA: Insufficient documentation

## 2018-10-13 NOTE — ED Notes (Signed)
No answer for room x2 
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
## 2018-11-26 ENCOUNTER — Emergency Department (HOSPITAL_COMMUNITY)
Admission: EM | Admit: 2018-11-26 | Discharge: 2018-11-26 | Disposition: A | Discharge status: Home / Self Care | Payer: Medicaid Other | Attending: Emergency Medicine | Admitting: Emergency Medicine

## 2018-11-26 ENCOUNTER — Encounter (HOSPITAL_COMMUNITY): Payer: Self-pay | Admitting: *Deleted

## 2018-11-26 ENCOUNTER — Other Ambulatory Visit: Payer: Self-pay

## 2018-11-26 DIAGNOSIS — M545 Low back pain, unspecified: Secondary | ICD-10-CM

## 2018-11-26 DIAGNOSIS — F17210 Nicotine dependence, cigarettes, uncomplicated: Secondary | ICD-10-CM | POA: Insufficient documentation

## 2018-11-26 DIAGNOSIS — M25531 Pain in right wrist: Secondary | ICD-10-CM | POA: Insufficient documentation

## 2018-11-26 DIAGNOSIS — M25562 Pain in left knee: Secondary | ICD-10-CM | POA: Insufficient documentation

## 2018-11-26 DIAGNOSIS — M25561 Pain in right knee: Secondary | ICD-10-CM | POA: Insufficient documentation
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
## 2018-11-26 NOTE — Discharge Instructions (Addendum)
Continue taking ibuprofen as before.  Follow-up with orthopedic surgery if symptoms persist.  The contact information for [facility] has been provided in this discharge summary for you to call and make these arrangements.
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
## 2019-01-28 ENCOUNTER — Other Ambulatory Visit: Payer: Self-pay

## 2019-01-28 ENCOUNTER — Emergency Department (HOSPITAL_COMMUNITY)
Admission: EM | Admit: 2019-01-28 | Discharge: 2019-01-28 | Disposition: A | Discharge status: Home / Self Care | Payer: Self-pay | Attending: Emergency Medicine | Admitting: Emergency Medicine

## 2019-01-28 ENCOUNTER — Encounter (HOSPITAL_COMMUNITY): Payer: Self-pay

## 2019-01-28 DIAGNOSIS — Z23 Encounter for immunization: Secondary | ICD-10-CM | POA: Insufficient documentation

## 2019-01-28 DIAGNOSIS — F17210 Nicotine dependence, cigarettes, uncomplicated: Secondary | ICD-10-CM | POA: Insufficient documentation

## 2019-01-28 DIAGNOSIS — W290XXA Contact with powered kitchen appliance, initial encounter: Secondary | ICD-10-CM | POA: Insufficient documentation

## 2019-01-28 DIAGNOSIS — Y990 Civilian activity done for income or pay: Secondary | ICD-10-CM | POA: Insufficient documentation

## 2019-01-28 DIAGNOSIS — Y93G1 Activity, food preparation and clean up: Secondary | ICD-10-CM | POA: Insufficient documentation

## 2019-01-28 DIAGNOSIS — Y929 Unspecified place or not applicable: Secondary | ICD-10-CM | POA: Insufficient documentation

## 2019-01-28 DIAGNOSIS — S61209A Unspecified open wound of unspecified finger without damage to nail, initial encounter: Secondary | ICD-10-CM

## 2019-01-28 DIAGNOSIS — S61200A Unspecified open wound of right index finger without damage to nail, initial encounter: Secondary | ICD-10-CM | POA: Insufficient documentation

## 2019-01-28 MED ORDER — HYDROCODONE-ACETAMINOPHEN 5-325 MG PO TABS
1.0000 | ORAL_TABLET | Freq: Once | ORAL | Status: AC
  Administered 2019-01-28: 1 via ORAL
  Filled 2019-01-28: qty 1

## 2019-01-28 MED ORDER — LIDOCAINE-EPINEPHRINE (PF) 2 %-1:200000 IJ SOLN
10.0000 mL | Freq: Once | INTRAMUSCULAR | Status: AC
  Administered 2019-01-28: 10 mL
  Filled 2019-01-28: qty 20

## 2019-01-28 MED ORDER — TETANUS-DIPHTH-ACELL PERTUSSIS 5-2.5-18.5 LF-MCG/0.5 IM SUSP
0.5000 mL | Freq: Once | INTRAMUSCULAR | Status: AC
  Administered 2019-01-28: 0.5 mL via INTRAMUSCULAR
  Filled 2019-01-28: qty 0.5

## 2019-01-28 NOTE — ED Triage Notes (Signed)
Pt sliced her index finger on a meat slicer at work, bleeding is controlled and finger is wrapped

## 2019-01-28 NOTE — Discharge Instructions (Addendum)
Evaluated today for laceration to her hand.  This is called a dermal avulsion.  I have Dermabond to the area.  Please try not to get wet over the next 24 to 48 hours.  This area of skin will have to heal from the bottom up.  May continue take Tylenol and ibuprofen as needed for pain.  This area starts to bleed please hold pressure.  Follow-up with PCP for reevaluation over the next week.  Return to ED for any worsening symptoms.

## 2019-01-28 NOTE — ED Provider Notes (Signed)
[facility]-EMERGENCY DEPT Provider Note   CSN: [id number] Arrival date & time: 01/28/19  1935    History   Chief Complaint Laceration   HPI Kendra Blake is a 38 y.o. female with past medical history significant for chronic anemia, PTSD, anxiety who presents for evaluation of hand laceration.  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  Patient states she sustained a laceration to the medial surface.  Patient states bleeding was controlled after pressure.  She has had pain to this area.  She rates her pain a 7/10.  Pain does not radiate.  Is not take anything for pain PTA.  Patient states she is unsure if she is up-to-date on her tetanus shot.  Denies fever, chills, nausea, vomiting, chest pain, shortness of breath, abdominal pain, decreased range of motion in her extremities, numbness or tingling.  Denies additional aggravating or alleviating factors.  Area Is approximately 1.5 cm long.  History obtained from patient.  No interpreter was used.     HPI  Past Medical History:  Diagnosis Date  . Anemia   . Anxiety   . PTSD (post-traumatic stress disorder)     Patient Active Problem List   Diagnosis Date Noted  . Post traumatic stress disorder (PTSD) 07/02/2014    Past Surgical History:  Procedure Laterality Date  . CARTILAGE SURGERY    . CHEST SURGERY    . LIGAMENT REPAIR       OB History    Gravida  1   Para  1   Term  1   Preterm      AB      Living  1     SAB      TAB      Ectopic      Multiple      Live Births  1            Home Medications    Prior to Admission medications   Medication Sig Start Date End Date Taking? Authorizing Provider  ibuprofen (ADVIL,MOTRIN) 600 MG tablet Take 1 tablet (600 mg total) by mouth every 6 (six) hours as needed. Patient not taking: Reported on 05/28/2018 03/02/18   Ward, Chase Picket, PA-C  methocarbamol (ROBAXIN) 500 MG  tablet Take 1 tablet (500 mg total) by mouth 2 (two) times daily. 05/28/18   Vivianne Master, PA-C  metroNIDAZOLE (FLAGYL) 500 MG tablet Take 1 tablet (500 mg total) by mouth 2 (two) times daily. Patient not taking: Reported on 05/15/2018 12/28/16   Poe, Deirdre C, CNM  naproxen (NAPROSYN) 500 MG tablet Take 1 tablet (500 mg total) by mouth 2 (two) times daily. 05/28/18   Vivianne Master, PA-C  oxyCODONE-acetaminophen (PERCOCET) 5-325 MG tablet Take 1 tablet by mouth every 6 (six) hours as needed. Patient not taking: Reported on 05/28/2018 05/15/18   Legrand Rams    Family History History reviewed. No pertinent family history.  Social History Social History   Tobacco Use  . Smoking status: Current Some Day Smoker    Packs/day: 0.00    Types: Cigarettes  . Smokeless tobacco: Never Used  Substance Use Topics  . Alcohol use: Yes    Comment: occasional  . Drug use: No     Allergies   Patient has no known allergies.   Review of Systems Review of Systems  Constitutional: Negative.   HENT: Negative.   Respiratory: Negative.  Cardiovascular: Negative.   Gastrointestinal: Negative.   Genitourinary: Negative.   Musculoskeletal: Negative.   Skin: Positive for wound.  Neurological: Negative.   All other systems reviewed and are negative.    Physical Exam Updated Vital Signs BP (!) 148/83 (BP Location: Left Arm)   Pulse 91   Temp 97.6 F (36.4 C) (Oral)   Resp 20   Ht 5' 5.5" (1.664 m)   Wt 63.5 kg   LMP 01/14/2019   SpO2 100%   BMI 22.94 kg/m   Physical Exam Vitals signs and nursing note reviewed.  Constitutional:      General: She is not in acute distress.    Appearance: She is well-developed.  HENT:     Head: Atraumatic.     Nose: Nose normal.     Mouth/Throat:     Mouth: Mucous membranes are moist.     Pharynx: Oropharynx is clear.  Eyes:     Pupils: Pupils are equal, round, and reactive to light.  Neck:     Musculoskeletal: Normal range of motion.    Cardiovascular:     Rate and Rhythm: Normal rate.  Pulmonary:     Effort: Pulmonary effort is normal. No respiratory distress.  Abdominal:     General: There is no distension.  Musculoskeletal: Normal range of motion.     Comments: No edema, erythema or warmth.  Full range of motion right upper extremity.  Skin:    General: Skin is warm and dry.     Comments: 1.5 cm dermal avulsion to medial surface of second digit on right upper extremity.  Bleeding controlled.  Neurological:     Mental Status: She is alert.     Motor: No weakness.     Comments: Intact sensation sharp and dull to bilateral upper extremity.  5/5 grip strength to upper extremity.        ED Treatments / Results  Labs (all labs ordered are listed, but only abnormal results are displayed) Labs Reviewed - No data to display  EKG None  Radiology No results found.  Procedures .Marland Kitchen.Laceration Repair Date/Time: 01/28/2019 8:51 PM Performed by: Linwood DibblesHenderly, Wolf Boulay A, PA-C Authorized by: Linwood DibblesHenderly, Windy Dudek A, PA-C   Consent:    Consent obtained:  Verbal   Consent given by:  Patient   Risks discussed:  Infection, need for additional repair, pain, poor cosmetic result and poor wound healing   Alternatives discussed:  No treatment and delayed treatment Universal protocol:    Procedure explained and questions answered to patient or proxy's satisfaction: yes     Relevant documents present and verified: yes     Test results available and properly labeled: yes     Imaging studies available: yes     Required blood products, implants, devices, and special equipment available: yes     Site/side marked: yes     Immediately prior to procedure, a time out was called: yes     Patient identity confirmed:  Verbally with patient Anesthesia (see MAR for exact dosages):    Anesthesia method:  Topical application Laceration details:    Location:  Finger   Finger location:  R index finger   Length (cm):  1.5 Repair type:     Repair type:  Simple Pre-procedure details:    Preparation:  Patient was prepped and draped in usual sterile fashion Exploration:    Hemostasis achieved with:  Direct pressure   Wound exploration: wound explored through full range of motion and entire depth of wound probed  and visualized   Treatment:    Area cleansed with:  Betadine   Amount of cleaning:  Standard Skin repair:    Repair method:  Tissue adhesive Approximation:    Approximation:  Close Post-procedure details:    Dressing:  Open (no dressing)   Patient tolerance of procedure:  Tolerated well, no immediate complications   (including critical care time)  Medications Ordered in ED Medications  HYDROcodone-acetaminophen (NORCO/VICODIN) 5-325 MG per tablet 1 tablet (1 tablet Oral Given 01/28/19 2043)  lidocaine-EPINEPHrine (XYLOCAINE W/EPI) 2 %-1:200000 (PF) injection 10 mL (10 mLs Infiltration Given 01/28/19 2043)  Tdap (BOOSTRIX) injection 0.5 mL (0.5 mLs Intramuscular Given 01/28/19 2043)     Initial Impression / Assessment and Plan / ED Course  I have reviewed the triage vital signs and the nursing notes.  Pertinent labs & imaging results that were available during my care of the patient were reviewed by me and considered in my medical decision making (see chart for details).  38 year old female appears otherwise well presents for evaluation of hand laceration.  Afebrile, nonseptic, non-ill-appearing.  Patient with 1.5 cm dermal avulsion of skin to medial surface of second digit on right upper extremity.  Bleeding controlled.  No edema, erythema, ecchymosis or warmth.  Normal musculoskeletal exam.  Neurovascularly intact.  We will plan to update tetanus as well as Dermabond avulsed skin.  No laceration to suture.  See procedure note.  Patient tolerated well. Pressure irrigation performed. Wound explored and base of wound visualized in a bloodless field without evidence of foreign body.  Laceration occurred < 8 hours prior to  repair which was well tolerated.  Pt has no comorbidities to effect normal wound healing. Pt discharged  without antibiotics. Discussed return precautions.  Patient to follow-up with PCP for reevaluation over the next 2 days for wound evaluation.  Patient hemodynamically stable and appropriate for discharge at this time.  Low suspicion for acute emergent pathology which would require inpatient management or further work-up in the emergency department.  Patient voiced understanding of return precautions and is agreeable for follow-up.       Final Clinical Impressions(s) / ED Diagnoses   Final diagnoses:  Avulsion of skin of finger, initial encounter    ED Discharge Orders    None       Rashena Dowling A, PA-C 01/28/19 2121    Raeford Razor, MD 01/28/19 2243

## 2021-04-19 ENCOUNTER — Emergency Department (HOSPITAL_COMMUNITY)
Admission: EM | Admit: 2021-04-19 | Discharge: 2021-04-20 | Disposition: A | Discharge status: Home / Self Care | Payer: Self-pay | Attending: Emergency Medicine | Admitting: Emergency Medicine

## 2021-04-19 DIAGNOSIS — R6883 Chills (without fever): Secondary | ICD-10-CM | POA: Insufficient documentation

## 2021-04-19 DIAGNOSIS — N939 Abnormal uterine and vaginal bleeding, unspecified: Secondary | ICD-10-CM | POA: Insufficient documentation

## 2021-04-19 DIAGNOSIS — R112 Nausea with vomiting, unspecified: Secondary | ICD-10-CM | POA: Insufficient documentation

## 2021-04-19 DIAGNOSIS — Z20822 Contact with and (suspected) exposure to covid-19: Secondary | ICD-10-CM | POA: Insufficient documentation

## 2021-04-19 DIAGNOSIS — D649 Anemia, unspecified: Secondary | ICD-10-CM | POA: Insufficient documentation

## 2021-04-19 DIAGNOSIS — R1084 Generalized abdominal pain: Secondary | ICD-10-CM | POA: Insufficient documentation

## 2021-04-19 DIAGNOSIS — F17210 Nicotine dependence, cigarettes, uncomplicated: Secondary | ICD-10-CM | POA: Insufficient documentation

## 2021-04-19 DIAGNOSIS — R059 Cough, unspecified: Secondary | ICD-10-CM | POA: Insufficient documentation

## 2021-04-20 ENCOUNTER — Other Ambulatory Visit: Payer: Self-pay

## 2021-04-20 ENCOUNTER — Encounter (HOSPITAL_COMMUNITY): Payer: Self-pay

## 2021-04-20 LAB — CBC WITH DIFFERENTIAL/PLATELET
Abs Immature Granulocytes: 0.02 10*3/uL (ref 0.00–0.07)
Basophils Absolute: 0 10*3/uL (ref 0.0–0.1)
Basophils Relative: 0 %
Eosinophils Absolute: 0.2 10*3/uL (ref 0.0–0.5)
Eosinophils Relative: 3 %
HCT: 24.2 % — ABNORMAL LOW (ref 36.0–46.0)
Hemoglobin: 6.9 g/dL — CL (ref 12.0–15.0)
Immature Granulocytes: 0 %
Lymphocytes Relative: 33 %
Lymphs Abs: 2.2 10*3/uL (ref 0.7–4.0)
MCH: 19.8 pg — ABNORMAL LOW (ref 26.0–34.0)
MCHC: 28.5 g/dL — ABNORMAL LOW (ref 30.0–36.0)
MCV: 69.3 fL — ABNORMAL LOW (ref 80.0–100.0)
Monocytes Absolute: 0.6 10*3/uL (ref 0.1–1.0)
Monocytes Relative: 9 %
Neutro Abs: 3.7 10*3/uL (ref 1.7–7.7)
Neutrophils Relative %: 55 %
Platelets: 382 10*3/uL (ref 150–400)
RBC: 3.49 MIL/uL — ABNORMAL LOW (ref 3.87–5.11)
RDW: 19.6 % — ABNORMAL HIGH (ref 11.5–15.5)
WBC: 6.8 10*3/uL (ref 4.0–10.5)
nRBC: 0 % (ref 0.0–0.2)

## 2021-04-20 LAB — COMPREHENSIVE METABOLIC PANEL
ALT: 16 U/L (ref 0–44)
AST: 29 U/L (ref 15–41)
Albumin: 3.5 g/dL (ref 3.5–5.0)
Alkaline Phosphatase: 46 U/L (ref 38–126)
Anion gap: 4 — ABNORMAL LOW (ref 5–15)
BUN: 19 mg/dL (ref 6–20)
CO2: 25 mmol/L (ref 22–32)
Calcium: 9 mg/dL (ref 8.9–10.3)
Chloride: 108 mmol/L (ref 98–111)
Creatinine, Ser: 0.93 mg/dL (ref 0.44–1.00)
GFR, Estimated: >60 mL/min (ref >60)
Glucose, Bld: 111 mg/dL — ABNORMAL HIGH (ref 70–99)
Potassium: 3.7 mmol/L (ref 3.5–5.1)
Sodium: 137 mmol/L (ref 135–145)
Total Bilirubin: 0.2 mg/dL — ABNORMAL LOW (ref 0.3–1.2)
Total Protein: 7.4 g/dL (ref 6.5–8.1)

## 2021-04-20 LAB — RESP PANEL BY RT-PCR (FLU A&B, COVID) ARPGX2
Influenza A by PCR: NEGATIVE
Influenza B by PCR: NEGATIVE
SARS Coronavirus 2 by RT PCR: NEGATIVE

## 2021-04-20 LAB — I-STAT BETA HCG BLOOD, ED (MC, WL, AP ONLY): I-stat hCG, quantitative: <5 m[IU]/mL (ref <5)

## 2021-04-20 LAB — POC OCCULT BLOOD, ED: Fecal Occult Bld: NEGATIVE

## 2021-04-20 LAB — LIPASE, BLOOD: Lipase: 44 U/L (ref 11–51)

## 2021-04-20 MED ORDER — ONDANSETRON 4 MG PO TBDP
4.0000 mg | ORAL_TABLET | Freq: Once | ORAL | Status: AC
  Administered 2021-04-20: 4 mg via ORAL
  Filled 2021-04-20: qty 1

## 2021-04-20 MED ORDER — KETOROLAC TROMETHAMINE 15 MG/ML IJ SOLN
15.0000 mg | Freq: Once | INTRAMUSCULAR | Status: AC
  Administered 2021-04-20: 15 mg via INTRAVENOUS
  Filled 2021-04-20: qty 1

## 2021-04-20 MED ORDER — FERROUS SULFATE 325 (65 FE) MG PO TABS: 325.0000 mg | ORAL_TABLET | Freq: Every day | ORAL | 0 refills | Status: AC

## 2021-04-20 MED ORDER — SODIUM CHLORIDE 0.9 % IV BOLUS (SEPSIS)
1000.0000 mL | Freq: Once | INTRAVENOUS | Status: AC
  Administered 2021-04-20: 1000 mL via INTRAVENOUS

## 2021-04-20 MED ORDER — ONDANSETRON HCL 4 MG/2ML IJ SOLN
4.0000 mg | Freq: Once | INTRAMUSCULAR | Status: AC
  Administered 2021-04-20: 4 mg via INTRAVENOUS
  Filled 2021-04-20: qty 2

## 2021-04-20 NOTE — ED Provider Notes (Signed)
[facility]-EMERGENCY DEPT Provider Note   CSN: [id number] Arrival date & time: 04/19/21  2334     History Chief Complaint  Patient presents with  . Abdominal Pain    Kendra Blake is a 40 y.o. female.  The history is provided by the patient.  Abdominal Pain Pain location:  Generalized Pain quality: aching and cramping   Pain radiates to:  Does not radiate Pain severity:  Moderate Onset quality:  Gradual Duration:  1 day Timing:  Intermittent Progression:  Unchanged Relieved by:  Nothing Worsened by:  Palpation Associated symptoms: chills, cough, diarrhea, nausea, vaginal bleeding and vomiting   Associated symptoms: no hematemesis, no hematochezia and no melena   Risk factors: has not had multiple surgeries   Patient reports over 1 day ago she began having nausea and headache, followed by diarrhea.  Since that time she has had multiple episodes of nonbloody diarrhea.  She also reports nonbloody emesis.  She also reports diffuse abdominal pain and cramping.  She also reports she is on her menstrual cycle and has frequent heavy periods     Past Medical History:  Diagnosis Date  . Anemia   . Anxiety   . PTSD (post-traumatic stress disorder)     Patient Active Problem List   Diagnosis Date Noted  . Post traumatic stress disorder (PTSD) 07/02/2014    Past Surgical History:  Procedure Laterality Date  . CARTILAGE SURGERY    . CHEST SURGERY    . LIGAMENT REPAIR       OB History    Gravida  1   Para  1   Term  1   Preterm      AB      Living  1     SAB      IAB      Ectopic      Multiple      Live Births  1           No family history on file.  Social History   Tobacco Use  . Smoking status: Current Some Day Smoker    Packs/day: 0.00    Types: Cigarettes  . Smokeless tobacco: Never Used  Vaping Use  . Vaping Use: Never used  Substance Use Topics  . Alcohol use: Yes    Comment: occasional  . Drug use: No     Home Medications Prior to Admission medications   Medication Sig Start Date End Date Taking? Authorizing Provider  ibuprofen (ADVIL) 200 MG tablet Take 400-800 mg by mouth every 6 (six) hours as needed for mild pain.   Yes [provider]    Allergies    Patient has no known allergies.  Review of Systems   Review of Systems  Constitutional: Positive for chills.  Respiratory: Positive for cough.   Gastrointestinal: Positive for abdominal pain, diarrhea, nausea and vomiting. Negative for blood in stool, hematemesis, hematochezia and melena.  Genitourinary: Positive for vaginal bleeding.       Heavy menstrual cycle  Neurological: Positive for headaches.  All other systems reviewed and are negative.   Physical Exam Updated Vital Signs BP 98/71   Pulse 83   Temp 98.5 F (36.9 C)   Resp 16   Ht 1.651 m (5\' 5" )   Wt 70.3 kg   LMP 04/10/2021 (Approximate)   SpO2 99%   BMI 25.79 kg/m   Physical Exam CONSTITUTIONAL: Well developed/well nourished HEAD: Normocephalic/atraumatic EYES: EOMI/PERRL NECK: supple no meningeal signs SPINE/BACK:entire spine  nontender CV: S1/S2 noted, no murmurs/rubs/gallops noted LUNGS: Lungs are clear to auscultation bilaterally, no apparent distress ABDOMEN: soft, mild diffuse tenderness, no rebound or guarding, bowel sounds noted throughout abdomen Rectal-no blood or melena noted, female chaperone present GU:no cva tenderness NEURO: Pt is awake/alert/appropriate, moves all extremitiesx4.  No facial droop.   EXTREMITIES: pulses normal/equal, full ROM SKIN: warm, color normal PSYCH: no abnormalities of mood noted, alert and oriented to situation  ED Results / Procedures / Treatments   Labs (all labs ordered are listed, but only abnormal results are displayed) Labs Reviewed  CBC WITH DIFFERENTIAL/PLATELET - Abnormal; Notable for the following components:      Result Value   RBC 3.49 (*)    Hemoglobin 6.9 (*)    HCT 24.2 (*)     MCV 69.3 (*)    MCH 19.8 (*)    MCHC 28.5 (*)    RDW 19.6 (*)    All other components within normal limits  COMPREHENSIVE METABOLIC PANEL - Abnormal; Notable for the following components:   Glucose, Bld 111 (*)    Total Bilirubin 0.2 (*)    Anion gap 4 (*)    All other components within normal limits  RESP PANEL BY RT-PCR (FLU A&B, COVID) ARPGX2  LIPASE, BLOOD  I-STAT BETA HCG BLOOD, ED ([facility], [facility], [facility] ONLY)  POC OCCULT BLOOD, ED    EKG None  Radiology No results found.  Procedures Procedures   Medications Ordered in ED Medications  ondansetron (ZOFRAN-ODT) disintegrating tablet 4 mg (4 mg Oral Given 04/20/21 0205)  sodium chloride 0.9 % bolus 1,000 mL (1,000 mLs Intravenous New Bag/Given 04/20/21 0401)  ondansetron (ZOFRAN) injection 4 mg (4 mg Intravenous Given 04/20/21 0400)  ketorolac (TORADOL) 15 MG/ML injection 15 mg (15 mg Intravenous Given 04/20/21 0401)    ED Course  I have reviewed the triage vital signs and the nursing notes.  Pertinent labs results that were available during my care of the patient were reviewed by me and considered in my medical decision making (see chart for details).    MDM Rules/Calculators/A&P                          4:33 AM Patient reports recent episodes of vomiting and diarrhea.  No recent travel.  No recent antibiotics. Patient reports she has a history of heavy vaginal bleeding with her menstrual cycle.  She also reports a history of anemia.  Patient does have anemia here today, this is likely an acute on chronic issue due to her heavy menstrual cycles 5:23 AM Patient feels improved.  She reports it pain is resolved. She feels appropriate for discharge home.  She has not been symptomatic from her anemia. At this point, she would not require blood transfusion.  She will be restarted on iron tablets and referred to GYN.  Patient is agreeable with this plan  Patient is appropriate for d/c home.  I doubt acute abdominal emergency at this  time.  We discussed strict ER return precautions including abdominal pain that migrates to RLQ, fever >100.66F with repetitive vomiting over next 8-12 hours Final Clinical Impression(s) / ED Diagnoses Final diagnoses:  Generalized abdominal pain  Nausea vomiting and diarrhea  Acute anemia    Rx / DC Orders ED Discharge Orders         Ordered    ferrous sulfate 325 (65 FE) MG tablet  Daily        04/20/21 0522  Zadie Rhine, MD 04/20/21 [phone]

## 2021-04-20 NOTE — Discharge Instructions (Addendum)

## 2021-04-20 NOTE — ED Notes (Signed)
Asked patient to provide urine sample. Patient unable to at this time.  

## 2021-04-20 NOTE — ED Notes (Signed)
Dr. Bebe Shaggy aware of patients hemoglobin at discharge.

## 2021-04-20 NOTE — ED Provider Notes (Signed)
Emergency Medicine Provider Triage Evaluation Note  Kendra Blake , a 40 y.o. female  was evaluated in triage.  Pt complains of nausea, diarrhea, body aches, headache which began yesterday.  Reports 2 episodes of nonbloody nonbilious vomiting.  Feels poorly.  Denies any urinary symptoms.  No blood in her stool.  States that her body aches and nausea are giving her the "shakes".  Denies any fevers or chills.  No known sick contacts.  She is not vaccinated for COVID.  She reports some abdominal pain, however she describes it more as a soreness and thinks this is from all the vomiting  Review of Systems  Positive: As above Negative: As above  Physical Exam  BP 109/85   Pulse 97   Temp 98.5 F (36.9 C)   Resp 18   SpO2 100%  Gen:   Awake, no distress   Resp:  Normal effort  MSK:   Moves extremities without difficulty  Other:  Very minimal generalized abdominal tenderness  Medical Decision Making  Medically screening exam initiated at 12:01 AM.  Appropriate orders placed.  Kendra Grissett was informed that the remainder of the evaluation will be completed by another provider, this initial triage assessment does not replace that evaluation, and the importance of remaining in the ED until their evaluation is complete.     Mare Ferrari, PA-C 04/20/21 0002    Zadie Rhine, MD 04/20/21 [phone]

## 2021-04-20 NOTE — ED Triage Notes (Signed)
Pt to ED with c/o NVD and abdominal pain which began on Wednesday. Arrives A+O, VSS.

## 2021-08-02 ENCOUNTER — Other Ambulatory Visit: Payer: Self-pay

## 2021-08-02 ENCOUNTER — Encounter (HOSPITAL_COMMUNITY): Payer: Self-pay | Admitting: *Deleted

## 2021-08-02 ENCOUNTER — Emergency Department (HOSPITAL_COMMUNITY): Payer: Medicaid Other

## 2021-08-02 ENCOUNTER — Emergency Department (HOSPITAL_COMMUNITY)
Admission: EM | Admit: 2021-08-02 | Discharge: 2021-08-02 | Disposition: A | Discharge status: Home / Self Care | Payer: Medicaid Other | Attending: Emergency Medicine | Admitting: Emergency Medicine

## 2021-08-02 DIAGNOSIS — Z28310 Unvaccinated for covid-19: Secondary | ICD-10-CM | POA: Insufficient documentation

## 2021-08-02 DIAGNOSIS — U071 COVID-19: Secondary | ICD-10-CM | POA: Insufficient documentation

## 2021-08-02 DIAGNOSIS — F17210 Nicotine dependence, cigarettes, uncomplicated: Secondary | ICD-10-CM | POA: Insufficient documentation

## 2021-08-02 NOTE — Discharge Instructions (Addendum)
You are seen in the ER today for evaluation after COVID-19 infection for chronic fatigue and cough.  These are normal symptoms experience for several weeks after COVID.  Please continue to hydrate and increase your fluid intake as tolerated.  May take over-the-counter medications as needed for body aches.  Return to ER for develop any new chest pain trickled to breathing, nausea or vomiting that does not stop, or any other new or severe symptoms.

## 2021-08-02 NOTE — ED Notes (Signed)
An After Visit Summary was printed and given to the patient. Discharge instructions given and no further questions at this time.  
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
## 2021-08-02 NOTE — ED Triage Notes (Signed)
Pt complains of fatigue and cough. She tested positive on 8/9. She reports feeling better, headache and fevers resolved.

## 2021-09-05 ENCOUNTER — Emergency Department (HOSPITAL_COMMUNITY)
Admission: EM | Admit: 2021-09-05 | Discharge: 2021-09-05 | Disposition: A | Discharge status: Home / Self Care | Payer: Medicaid Other | Attending: Emergency Medicine | Admitting: Emergency Medicine

## 2021-09-05 ENCOUNTER — Other Ambulatory Visit: Payer: Self-pay

## 2021-09-05 ENCOUNTER — Encounter (HOSPITAL_COMMUNITY): Payer: Self-pay | Admitting: *Deleted

## 2021-09-05 DIAGNOSIS — Z28310 Unvaccinated for covid-19: Secondary | ICD-10-CM | POA: Insufficient documentation

## 2021-09-05 DIAGNOSIS — R519 Headache, unspecified: Secondary | ICD-10-CM

## 2021-09-05 DIAGNOSIS — F17210 Nicotine dependence, cigarettes, uncomplicated: Secondary | ICD-10-CM | POA: Insufficient documentation

## 2021-09-05 LAB — CBC WITH DIFFERENTIAL/PLATELET
Abs Immature Granulocytes: 0.02 10*3/uL (ref 0.00–0.07)
Basophils Absolute: 0.1 10*3/uL (ref 0.0–0.1)
Basophils Relative: 1 %
Eosinophils Absolute: 0.1 10*3/uL (ref 0.0–0.5)
Eosinophils Relative: 1 %
HCT: 26.4 % — ABNORMAL LOW (ref 36.0–46.0)
Hemoglobin: 8.2 g/dL — ABNORMAL LOW (ref 12.0–15.0)
Immature Granulocytes: 0 %
Lymphocytes Relative: 36 %
Lymphs Abs: 2.7 10*3/uL (ref 0.7–4.0)
MCH: 24 pg — ABNORMAL LOW (ref 26.0–34.0)
MCHC: 31.1 g/dL (ref 30.0–36.0)
MCV: 77.4 fL — ABNORMAL LOW (ref 80.0–100.0)
Monocytes Absolute: 0.5 10*3/uL (ref 0.1–1.0)
Monocytes Relative: 7 %
Neutro Abs: 4.1 10*3/uL (ref 1.7–7.7)
Neutrophils Relative %: 55 %
Platelets: 267 10*3/uL (ref 150–400)
RBC: 3.41 MIL/uL — ABNORMAL LOW (ref 3.87–5.11)
RDW: 19.7 % — ABNORMAL HIGH (ref 11.5–15.5)
WBC: 7.4 10*3/uL (ref 4.0–10.5)
nRBC: 0 % (ref 0.0–0.2)

## 2021-09-05 LAB — BASIC METABOLIC PANEL
Anion gap: 10 (ref 5–15)
BUN: 16 mg/dL (ref 6–20)
CO2: 20 mmol/L — ABNORMAL LOW (ref 22–32)
Calcium: 9 mg/dL (ref 8.9–10.3)
Chloride: 105 mmol/L (ref 98–111)
Creatinine, Ser: 0.68 mg/dL (ref 0.44–1.00)
GFR, Estimated: >60 mL/min (ref >60)
Glucose, Bld: 70 mg/dL (ref 70–99)
Potassium: 4 mmol/L (ref 3.5–5.1)
Sodium: 135 mmol/L (ref 135–145)

## 2021-09-05 MED ORDER — SODIUM CHLORIDE 0.9 % IV BOLUS
500.0000 mL | Freq: Once | INTRAVENOUS | Status: AC
  Administered 2021-09-05: 500 mL via INTRAVENOUS

## 2021-09-05 NOTE — ED Provider Notes (Signed)
[facility]-EMERGENCY DEPT Provider Note   CSN: [id number] Arrival date & time: 09/05/21  6063     History Chief Complaint  Patient presents with   Headache    Kendra Blake is a 40 y.o. female.  HPI She reports that since recovering from COVID, 3 weeks ago she has had persistent brain fog, headache, and sleepiness.  She states she had COVID symptoms for about 3 weeks.  She was not vaccinated prior to the infection.  She denies fever, chills, nasal discharge, productive cough, shortness of breath, weakness or dizziness.  She works a job as a Solicitor at [employer].  There are no other known active modifying factors.    Past Medical History:  Diagnosis Date   Anemia    Anxiety    PTSD (post-traumatic stress disorder)     Patient Active Problem List   Diagnosis Date Noted   Post traumatic stress disorder (PTSD) 07/02/2014    Past Surgical History:  Procedure Laterality Date   CARTILAGE SURGERY     CHEST SURGERY     LIGAMENT REPAIR       OB History     Gravida  1   Para  1   Term  1   Preterm      AB      Living  1      SAB      IAB      Ectopic      Multiple      Live Births  1           No family history on file.  Social History   Tobacco Use   Smoking status: Some Days    Packs/day: 0.00    Types: Cigarettes   Smokeless tobacco: Never  Vaping Use   Vaping Use: Never used  Substance Use Topics   Alcohol use: Yes    Comment: occasional   Drug use: No    Home Medications Prior to Admission medications   Medication Sig Start Date End Date Taking? Authorizing Provider  ferrous sulfate 325 (65 FE) MG tablet Take 1 tablet (325 mg total) by mouth daily. 04/20/21  Yes Zadie Rhine, MD  ibuprofen (ADVIL) 200 MG tablet Take 400-800 mg by mouth every 6 (six) hours as needed for mild pain.   Yes [provider]  Omega-3 Fatty Acids (OMEGA-3 FISH OIL PO) Take 1 capsule by mouth daily.   Yes [provider]    Allergies    Patient has no known allergies.  Review of Systems   Review of Systems  All other systems reviewed and are negative.  Physical Exam Updated Vital Signs BP 128/69   Pulse 79   Temp 98.9 F (37.2 C) (Oral)   Resp 16   LMP 08/06/2021   SpO2 100%   Physical Exam Vitals and nursing note reviewed.  Constitutional:      General: She is not in acute distress.    Appearance: She is well-developed. She is not ill-appearing, toxic-appearing or diaphoretic.  HENT:     Head: Normocephalic and atraumatic.     Right Ear: External ear normal.     Left Ear: External ear normal.     Mouth/Throat:     Mouth: Mucous membranes are dry.  Eyes:     Conjunctiva/sclera: Conjunctivae normal.     Pupils: Pupils are equal, round, and reactive to light.  Neck:     Trachea: Phonation normal.  Cardiovascular:  Rate and Rhythm: Normal rate and regular rhythm.     Heart sounds: Normal heart sounds.  Pulmonary:     Effort: Pulmonary effort is normal.     Breath sounds: Normal breath sounds.  Abdominal:     General: There is no distension.     Palpations: Abdomen is soft.     Tenderness: There is no abdominal tenderness.  Musculoskeletal:        General: Normal range of motion.     Cervical back: Normal range of motion and neck supple. No rigidity or tenderness.  Skin:    General: Skin is warm and dry.  Neurological:     Mental Status: She is alert and oriented to person, place, and time.     Cranial Nerves: No cranial nerve deficit.     Sensory: No sensory deficit.     Motor: No abnormal muscle tone.     Coordination: Coordination normal.     Comments: No dysarthria or aphasia.  Psychiatric:        Mood and Affect: Mood normal.        Behavior: Behavior normal.        Thought Content: Thought content normal.        Judgment: Judgment normal.    ED Results / Procedures / Treatments   Labs (all labs ordered are listed, but only abnormal results are  displayed) Labs Reviewed  BASIC METABOLIC PANEL - Abnormal; Notable for the following components:      Result Value   CO2 20 (*)    All other components within normal limits  CBC WITH DIFFERENTIAL/PLATELET - Abnormal; Notable for the following components:   RBC 3.41 (*)    Hemoglobin 8.2 (*)    HCT 26.4 (*)    MCV 77.4 (*)    MCH 24.0 (*)    RDW 19.7 (*)    All other components within normal limits    EKG None  Radiology No results found.  Procedures Procedures   Medications Ordered in ED Medications  sodium chloride 0.9 % bolus 500 mL (500 mLs Intravenous New Bag/Given 09/05/21 1033)    ED Course  I have reviewed the triage vital signs and the nursing notes.  Pertinent labs & imaging results that were available during my care of the patient were reviewed by me and considered in my medical decision making (see chart for details).    MDM Rules/Calculators/A&P                            Patient Vitals for the past 24 hrs:  BP Temp Temp src Pulse Resp SpO2  09/05/21 1136 128/69 -- -- 79 16 100 %  09/05/21 1030 (!) 112/96 -- -- 92 16 98 %  09/05/21 0923 (!) 137/93 -- -- 83 16 100 %  09/05/21 0828 126/76 -- -- 85 18 100 %  09/05/21 0722 (!) 159/97 98.9 F (37.2 C) Oral (!) 110 18 97 %    At the time of discharge- reevaluation with update and discussion. After initial assessment and treatment, an updated evaluation reveals she is comfortable and has no further complaints.  Findings discussed and questions answered. Mancel Bale   Medical Decision Making:  This patient is presenting for evaluation of headache, post-COVID, which does require a range of treatment options, and is a complaint that involves a moderate risk of morbidity and mortality. The differential diagnoses include stress headache, sinus infection, early signs of long  COVID. I decided to review old records, and in summary previously healthy female presenting with symptoms after COVID infection.  She was  not vaccinated and had a fairly prolonged infection for about 3 weeks.  No history of chronic headaches.  She is currently working as a Solicitor.  I did not require additional historical information from anyone.  Clinical Laboratory Tests Ordered, included CBC and Metabolic panel. Review indicates normal except anemia with low iron which she knows about and for which she is taking iron tablets.   Critical Interventions-clinical evaluation, IV fluids, observation and reassessment  After These Interventions, the Patient was reevaluated and was found improved with reassuring neurologic exam and clinical exam.  Incidental mild anemia found on blood testing she is already taking iron for it.  I suspect that she has malaise post-COVID without significant signs of acute sinusitis or other complications from COVID.  It is too early to diagnose long COVID.  She will be discharged with instructions to follow up with the PCP for ongoing symptoms as needed  CRITICAL CARE-no Performed by: Mancel Bale  Nursing Notes Reviewed/ Care Coordinated Applicable Imaging Reviewed Interpretation of Laboratory Data incorporated into ED treatment  The patient appears reasonably screened and/or stabilized for discharge and I doubt any other medical condition or other [facility] requiring further screening, evaluation, or treatment in the ED at this time prior to discharge.  Plan: Home Medications-OTC as needed; Home Treatments-gradually advance diet and activity; return here if the recommended treatment, does not improve the symptoms; Recommended follow up-PCP, as needed     Final Clinical Impression(s) / ED Diagnoses Final diagnoses:  Nonintractable headache, unspecified chronicity pattern, unspecified headache type    Rx / DC Orders ED Discharge Orders     None        Mancel Bale, MD 09/05/21 [phone]

## 2021-09-05 NOTE — Discharge Instructions (Signed)
Your symptoms are probably related to COVID.  Sometimes symptoms can persist for several weeks to months, if they persist for months we call this a long COVID syndrome.  You probably do not have that at this time.  In the meantime to improve your condition several things can help.  Make sure you are getting plenty of rest, at least 8 hours a day.  Drink 2 quarts of water each day.  Try to eat 3 meals a day.  Avoid alcohol, tobacco, and stressful situations.  If you have nasal congestion use Afrin nasal spray in both nostrils, twice a day for 3 to 4 days.  For headache use Tylenol or Motrin.  Follow-up with a primary care doctor if your symptoms do not improve within the next few days.

## 2021-09-05 NOTE — ED Triage Notes (Signed)
Pt complains of headache since having COVID 3 weeks ago, headache worse today.

## 2022-06-09 ENCOUNTER — Encounter (HOSPITAL_COMMUNITY): Payer: Self-pay

## 2022-06-09 ENCOUNTER — Emergency Department (HOSPITAL_COMMUNITY)
Admission: EM | Admit: 2022-06-09 | Discharge: 2022-06-09 | Disposition: A | Discharge status: Home / Self Care | Payer: Self-pay | Attending: Emergency Medicine | Admitting: Emergency Medicine

## 2022-06-09 ENCOUNTER — Other Ambulatory Visit: Payer: Self-pay

## 2022-06-09 DIAGNOSIS — M62838 Other muscle spasm: Secondary | ICD-10-CM

## 2022-06-09 DIAGNOSIS — M545 Low back pain, unspecified: Secondary | ICD-10-CM | POA: Insufficient documentation

## 2022-06-09 DIAGNOSIS — R110 Nausea: Secondary | ICD-10-CM | POA: Insufficient documentation

## 2022-06-09 DIAGNOSIS — R519 Headache, unspecified: Secondary | ICD-10-CM | POA: Insufficient documentation

## 2022-06-09 DIAGNOSIS — H53149 Visual discomfort, unspecified: Secondary | ICD-10-CM | POA: Insufficient documentation

## 2022-06-09 DIAGNOSIS — M542 Cervicalgia: Secondary | ICD-10-CM | POA: Insufficient documentation

## 2022-06-09 DIAGNOSIS — M549 Dorsalgia, unspecified: Secondary | ICD-10-CM

## 2022-06-09 MED ORDER — CYCLOBENZAPRINE HCL 10 MG PO TABS
10.0000 mg | ORAL_TABLET | Freq: Once | ORAL | Status: AC
  Administered 2022-06-09: 10 mg via ORAL
  Filled 2022-06-09: qty 1

## 2022-06-09 MED ORDER — SODIUM CHLORIDE 0.9 % IV BOLUS
1000.0000 mL | Freq: Once | INTRAVENOUS | Status: AC
  Administered 2022-06-09: 1000 mL via INTRAVENOUS

## 2022-06-09 MED ORDER — DIPHENHYDRAMINE HCL 50 MG/ML IJ SOLN
50.0000 mg | Freq: Once | INTRAMUSCULAR | Status: AC
  Administered 2022-06-09: 50 mg via INTRAVENOUS
  Filled 2022-06-09: qty 1

## 2022-06-09 MED ORDER — PROCHLORPERAZINE EDISYLATE 10 MG/2ML IJ SOLN
10.0000 mg | Freq: Once | INTRAMUSCULAR | Status: AC
  Administered 2022-06-09: 10 mg via INTRAVENOUS
  Filled 2022-06-09: qty 2

## 2022-06-09 MED ORDER — CYCLOBENZAPRINE HCL 10 MG PO TABS: 10.0000 mg | ORAL_TABLET | Freq: Two times a day (BID) | ORAL | 0 refills | Status: AC | PRN

## 2022-06-09 NOTE — ED Triage Notes (Signed)
Patient states she woke up yesterday with right lateral neck pain that radiates into the mid upper back since yesterday. Patient states, "I slept wrong."

## 2022-06-09 NOTE — ED Notes (Signed)
Ginger ale given for po challenge
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
## 2022-06-09 NOTE — Discharge Instructions (Signed)
Your history, exam, evaluation today are consistent with muscle spasms in your back and neck likely triggering your severe headache.  As the medicine seem to help, we do feel you are safe for discharge home.  Please use the muscle relaxants to help with your discomfort and please rest and stay hydrated.  Please follow-up with your primary doctor.  If any symptoms change or worsen acutely, please return to the nearest emergency department.

## 2022-08-02 ENCOUNTER — Emergency Department (HOSPITAL_COMMUNITY)
Admission: EM | Admit: 2022-08-02 | Discharge: 2022-08-02 | Discharge status: Against Medical Advice | Payer: Self-pay | Attending: Emergency Medicine | Admitting: Emergency Medicine

## 2022-08-02 ENCOUNTER — Emergency Department (HOSPITAL_COMMUNITY): Payer: Medicaid Other

## 2022-08-02 ENCOUNTER — Other Ambulatory Visit: Payer: Self-pay

## 2022-08-02 ENCOUNTER — Encounter (HOSPITAL_COMMUNITY): Payer: Self-pay

## 2022-08-02 DIAGNOSIS — M25571 Pain in right ankle and joints of right foot: Secondary | ICD-10-CM | POA: Insufficient documentation

## 2022-08-02 DIAGNOSIS — M25561 Pain in right knee: Secondary | ICD-10-CM | POA: Insufficient documentation

## 2022-08-02 DIAGNOSIS — M25572 Pain in left ankle and joints of left foot: Secondary | ICD-10-CM | POA: Insufficient documentation

## 2022-08-02 DIAGNOSIS — Y92410 Unspecified street and highway as the place of occurrence of the external cause: Secondary | ICD-10-CM | POA: Diagnosis not present

## 2022-08-02 DIAGNOSIS — M25562 Pain in left knee: Secondary | ICD-10-CM | POA: Diagnosis not present

## 2022-08-02 DIAGNOSIS — M791 Myalgia, unspecified site: Secondary | ICD-10-CM | POA: Diagnosis present

## 2022-08-02 DIAGNOSIS — R1084 Generalized abdominal pain: Secondary | ICD-10-CM | POA: Insufficient documentation

## 2022-08-02 DIAGNOSIS — F17210 Nicotine dependence, cigarettes, uncomplicated: Secondary | ICD-10-CM | POA: Diagnosis not present

## 2022-08-02 DIAGNOSIS — M545 Low back pain, unspecified: Secondary | ICD-10-CM | POA: Diagnosis not present

## 2022-08-02 MED ORDER — ACETAMINOPHEN 500 MG PO TABS: 1000.0000 mg | ORAL_TABLET | Freq: Once | ORAL | Status: DC

## 2022-08-02 MED ORDER — IBUPROFEN 800 MG PO TABS: 800.0000 mg | ORAL_TABLET | Freq: Once | ORAL | Status: DC

## 2022-08-02 MED ORDER — LORAZEPAM 0.5 MG PO TABS: 0.5000 mg | ORAL_TABLET | Freq: Once | ORAL | Status: DC

## 2022-08-02 NOTE — ED Notes (Signed)
Declined blood work. Willing to sign waiver for XR.

## 2022-08-02 NOTE — ED Notes (Signed)
Pt refused blood work  
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
## 2022-08-02 NOTE — ED Notes (Signed)
Eloped from treatment room. Returned labels to registration.
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
## 2024-01-02 DIAGNOSIS — R040 Epistaxis: Secondary | ICD-10-CM | POA: Diagnosis not present

## 2024-01-02 DIAGNOSIS — R519 Headache, unspecified: Secondary | ICD-10-CM | POA: Diagnosis not present

## 2024-01-02 DIAGNOSIS — H6121 Impacted cerumen, right ear: Secondary | ICD-10-CM | POA: Diagnosis not present

## 2024-05-31 ENCOUNTER — Emergency Department (HOSPITAL_COMMUNITY)
Admission: EM | Admit: 2024-05-31 | Discharge: 2024-06-01 | Disposition: A | Discharge status: Home / Self Care | Attending: Emergency Medicine | Admitting: Emergency Medicine

## 2024-05-31 ENCOUNTER — Other Ambulatory Visit: Payer: Self-pay

## 2024-05-31 DIAGNOSIS — W109XXA Fall (on) (from) unspecified stairs and steps, initial encounter: Secondary | ICD-10-CM | POA: Diagnosis not present

## 2024-05-31 DIAGNOSIS — M79652 Pain in left thigh: Secondary | ICD-10-CM

## 2024-05-31 DIAGNOSIS — S7012XA Contusion of left thigh, initial encounter: Secondary | ICD-10-CM | POA: Diagnosis present

## 2024-05-31 DIAGNOSIS — W19XXXA Unspecified fall, initial encounter: Secondary | ICD-10-CM

## 2024-05-31 NOTE — ED Triage Notes (Signed)
 PT coming in for a fall. Pt fell down the stairs a week ago. Having mild pain. Patient's states having a bruise on her left thigh but now feels knots on there. PT VSS, NAD

## 2024-06-01 ENCOUNTER — Emergency Department (HOSPITAL_COMMUNITY)

## 2024-06-01 NOTE — Discharge Instructions (Signed)
 It was a pleasure taking part in your care.  As discussed, your x-ray imaging is negative for any acute injury.  Please take ibuprofen  or Tylenol  at home.  You may apply ice to your left thigh.  Follow-up with your PCP for further management and care.  Return to the ED with any new or worsening symptoms.
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
## 2024-07-21 ENCOUNTER — Emergency Department (HOSPITAL_COMMUNITY)
Admission: EM | Admit: 2024-07-21 | Discharge: 2024-07-21 | Disposition: A | Discharge status: Home / Self Care | Attending: Emergency Medicine | Admitting: Emergency Medicine

## 2024-07-21 ENCOUNTER — Encounter (HOSPITAL_COMMUNITY): Payer: Self-pay

## 2024-07-21 ENCOUNTER — Emergency Department (HOSPITAL_COMMUNITY)

## 2024-07-21 DIAGNOSIS — S161XXA Strain of muscle, fascia and tendon at neck level, initial encounter: Secondary | ICD-10-CM | POA: Diagnosis not present

## 2024-07-21 DIAGNOSIS — W19XXXA Unspecified fall, initial encounter: Secondary | ICD-10-CM

## 2024-07-21 DIAGNOSIS — W109XXA Fall (on) (from) unspecified stairs and steps, initial encounter: Secondary | ICD-10-CM | POA: Insufficient documentation

## 2024-07-21 DIAGNOSIS — M542 Cervicalgia: Secondary | ICD-10-CM | POA: Diagnosis present

## 2024-07-21 DIAGNOSIS — S5002XA Contusion of left elbow, initial encounter: Secondary | ICD-10-CM | POA: Insufficient documentation

## 2024-07-21 MED ORDER — ACETAMINOPHEN 325 MG PO TABS
325.0000 mg | ORAL_TABLET | Freq: Once | ORAL | Status: AC
  Administered 2024-07-21 (×2): 325 mg via ORAL
  Filled 2024-07-21: qty 1

## 2024-07-21 MED ORDER — KETOROLAC TROMETHAMINE 30 MG/ML IJ SOLN
30.0000 mg | Freq: Once | INTRAMUSCULAR | Status: AC
  Administered 2024-07-21 (×2): 30 mg via INTRAMUSCULAR
  Filled 2024-07-21: qty 1

## 2024-07-21 MED ORDER — METHOCARBAMOL 500 MG PO TABS
500.0000 mg | ORAL_TABLET | Freq: Once | ORAL | Status: AC
  Administered 2024-07-21 (×2): 500 mg via ORAL
  Filled 2024-07-21: qty 1

## 2024-07-21 MED ORDER — METHOCARBAMOL 500 MG PO TABS: 500.0000 mg | ORAL_TABLET | Freq: Three times a day (TID) | ORAL | 0 refills | Status: AC | PRN
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
## 2024-07-21 NOTE — Discharge Instructions (Signed)

## 2024-07-21 NOTE — ED Triage Notes (Signed)
 Pt comes via [facility] from home after a fall down 5 stairs. Mechanical fall, no LOC, some nausea, pain in bilateral elbows, shoulders and mid back as well as headache.

## 2024-07-21 NOTE — ED Notes (Signed)
 Patient d/c with home care instructions.

## 2024-07-21 NOTE — ED Notes (Signed)
 0745 07/21/24 pt called stating she received the incorrect paper work at discharge with another patients name on it. She is aware of what her paperwork states and where her medications were sent. She will come pick up the correct paperwork and return the incorrect papers.

## 2024-11-13 ENCOUNTER — Emergency Department (HOSPITAL_COMMUNITY)
Admission: EM | Admit: 2024-11-13 | Discharge: 2024-11-14 | Disposition: A | Payer: Worker's Compensation | Attending: Emergency Medicine | Admitting: Emergency Medicine

## 2024-11-13 DIAGNOSIS — H5789 Other specified disorders of eye and adnexa: Secondary | ICD-10-CM
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
## 2024-11-14 MED ORDER — TETRACAINE HCL 0.5 % OP SOLN
2.0000 [drp] | Freq: Once | OPHTHALMIC | Status: AC
  Administered 2024-11-14: 2 [drp] via OPHTHALMIC
  Filled 2024-11-14: qty 4

## 2024-11-14 MED ORDER — FLUORESCEIN SODIUM 1 MG OP STRP
1.0000 | ORAL_STRIP | Freq: Once | OPHTHALMIC | Status: AC
  Administered 2024-11-14: 1 via OPHTHALMIC
  Filled 2024-11-14: qty 1

## 2024-11-14 NOTE — Discharge Instructions (Signed)
 Follow-up with ophthalmology if not improving in the next 2 days. Use a preservative-free lubricating eyedrops such as Systane.

## 2024-11-14 NOTE — ED Provider Notes (Signed)
 [place] EMERGENCY DEPARTMENT AT [facility] Provider Note   CSN: [id number] Arrival date & time: 11/13/24  2312     Patient presents with: Eye Problem   Kendra Blake is a 43 y.o. female.   43 year old female presents with complaint of foreign body sensation both of her eyes.  Patient was at work [employer] when she fell like something flew up and hit her in her eye.  Then fell like both of her eyes were irritated.  Patient thought maybe she had picked something out of her eye.  She states her vision has been blurry at times.  Does not wear glasses or contacts.  No other complaints or concerns.       Prior to Admission medications   Medication Sig Start Date End Date Taking? Authorizing Provider  cyclobenzaprine  (FLEXERIL ) 10 MG tablet Take 1 tablet (10 mg total) by mouth 2 (two) times daily as needed for muscle spasms. 06/09/22   Tegeler, Lonni PARAS, MD  ferrous sulfate  325 (65 FE) MG tablet Take 1 tablet (325 mg total) by mouth daily. 04/20/21   Midge Golas, MD  ibuprofen  (ADVIL ) 200 MG tablet Take 400-800 mg by mouth every 6 (six) hours as needed for mild pain.    [provider]  methocarbamol  (ROBAXIN ) 500 MG tablet Take 1 tablet (500 mg total) by mouth every 8 (eight) hours as needed for muscle spasms. 07/21/24   Long, Fonda MATSU, MD  Omega-3 Fatty Acids (OMEGA-3 FISH OIL PO) Take 1 capsule by mouth daily.    [provider]    Allergies: Patient has no known allergies.    Review of Systems Negative except as per HPI Updated Vital Signs BP 118/71   Pulse 86   Temp 98.2 F (36.8 C) (Oral)   Resp 14   Ht 5' 5 (1.651 m)   Wt 65.8 kg   SpO2 100%   BMI 24.13 kg/m   Physical Exam Vitals and nursing note reviewed.  Constitutional:      General: She is not in acute distress.    Appearance: She is well-developed. She is not diaphoretic.  HENT:     Head: Normocephalic and atraumatic.  Eyes:     General: Lids are normal. Lids are  everted, no foreign bodies appreciated. Vision grossly intact.        Right eye: No foreign body, discharge or hordeolum.        Left eye: No foreign body, discharge or hordeolum.     Extraocular Movements:     Right eye: Normal extraocular motion and no nystagmus.     Left eye: Normal extraocular motion and no nystagmus.     Conjunctiva/sclera:     Right eye: Right conjunctiva is not injected. No chemosis, exudate or hemorrhage.    Left eye: Left conjunctiva is not injected. No chemosis, exudate or hemorrhage.    Pupils:     Right eye: No corneal abrasion or fluorescein  uptake. Seidel exam negative.     Left eye: No corneal abrasion or fluorescein  uptake. Seidel exam negative.    Funduscopic exam:    Right eye: Red reflex present.        Left eye: Red reflex present.    Slit lamp exam:    Right eye: No foreign body or photophobia.     Left eye: No foreign body or photophobia.  Pulmonary:     Effort: Pulmonary effort is normal.  Neurological:     Mental Status: She is alert  and oriented to person, place, and time.  Psychiatric:        Behavior: Behavior normal.     (all labs ordered are listed, but only abnormal results are displayed) Labs Reviewed - No data to display  EKG: None  Radiology: No results found.   Procedures   Medications Ordered in the ED  fluorescein  ophthalmic strip 1 strip (has no administration in time range)  tetracaine  (PONTOCAINE) 0.5 % ophthalmic solution 2 drop (has no administration in time range)                                    Medical Decision Making Risk Prescription drug management.   43 year old female with complaint of foreign body sensation in both eyes after cooking at work tonight.  Exam is largely reassuring, gross visual acuity is intact. No fluorescein  uptake, no foreign body present. Recommend preservative-free lubricating eyedrops.  Will refer to ophthalmology for recheck if not improving.     Final diagnoses:  Eye  irritation    ED Discharge Orders     None          Beverley Leita DELENA DEVONNA 11/14/24 0055    Griselda Norris, MD 11/14/24 [phone]

## 2024-12-22 ENCOUNTER — Emergency Department (HOSPITAL_COMMUNITY)

## 2024-12-22 ENCOUNTER — Emergency Department (HOSPITAL_COMMUNITY): Admission: EM | Admit: 2024-12-22 | Discharge: 2024-12-22 | Disposition: A | Discharge status: Home / Self Care

## 2024-12-22 DIAGNOSIS — R0602 Shortness of breath: Secondary | ICD-10-CM | POA: Diagnosis not present

## 2024-12-22 DIAGNOSIS — Z79899 Other long term (current) drug therapy: Secondary | ICD-10-CM | POA: Diagnosis not present

## 2024-12-22 DIAGNOSIS — J101 Influenza due to other identified influenza virus with other respiratory manifestations: Secondary | ICD-10-CM | POA: Insufficient documentation

## 2024-12-22 DIAGNOSIS — R059 Cough, unspecified: Secondary | ICD-10-CM | POA: Diagnosis present

## 2024-12-22 LAB — RESP PANEL BY RT-PCR (RSV, FLU A&B, COVID)  RVPGX2
Influenza A by PCR: NEGATIVE
Influenza B by PCR: NEGATIVE
Resp Syncytial Virus by PCR: NEGATIVE
SARS Coronavirus 2 by RT PCR: NEGATIVE

## 2024-12-22 MED ORDER — PROMETHAZINE-DM 6.25-15 MG/5ML PO SYRP: 5.0000 mL | ORAL_SOLUTION | Freq: Four times a day (QID) | ORAL | 0 refills | Status: AC | PRN

## 2024-12-22 NOTE — Discharge Instructions (Signed)
 You are seen in the emergency department today for concerns of shortness of breath and cough.  You were negative on a viral swab however, given exposure someone was positive for flu, I assume that you likely do have influenza.  I started you on a cough medication to help further control your symptoms.  Try to make sure that you are staying well-hydrated.  For any concerns of new or worsening symptoms, return to the emergency department.

## 2024-12-22 NOTE — ED Triage Notes (Signed)
 C/o SOB with rest and exertion, dizziness, cough,  Symptoms started Thursday. Symptoms worsen every time she goes outside of her house. Some chest pain.  No hx of asthma.

## 2024-12-23 NOTE — ED Provider Notes (Signed)
 " [place] EMERGENCY DEPARTMENT AT [facility] Provider Note   CSN: [id number] Arrival date & time: 12/22/24  1309     Patient presents with: Shortness of Breath   Kendra Blake is a 44 y.o. female.  Patient presents to the emergency department today with concerns of shortness of breath and cough.  Reports history of anemia, anxiety.  Symptoms have been ongoing for about 6 days describes her shortness of breath as told to breathing through her nose.  Denies any chest tightness or chest pain.  Reports some discomfort in her chest when she coughs.  She denies any history of asthma.  Has not been to anyone who is sick as far she is aware and states that her son is now sick after he has been around her.   Shortness of Breath      Prior to Admission medications  Medication Sig Start Date End Date Taking? Authorizing Provider  promethazine -dextromethorphan (PROMETHAZINE -DM) 6.25-15 MG/5ML syrup Take 5 mLs by mouth 4 (four) times daily as needed for cough. 12/22/24  Yes Oni Dietzman A, PA-C  cyclobenzaprine  (FLEXERIL ) 10 MG tablet Take 1 tablet (10 mg total) by mouth 2 (two) times daily as needed for muscle spasms. 06/09/22   Tegeler, Lonni PARAS, MD  ferrous sulfate  325 (65 FE) MG tablet Take 1 tablet (325 mg total) by mouth daily. 04/20/21   Midge Golas, MD  ibuprofen  (ADVIL ) 200 MG tablet Take 400-800 mg by mouth every 6 (six) hours as needed for mild pain.    [provider]  methocarbamol  (ROBAXIN ) 500 MG tablet Take 1 tablet (500 mg total) by mouth every 8 (eight) hours as needed for muscle spasms. 07/21/24   Long, Fonda MATSU, MD  Omega-3 Fatty Acids (OMEGA-3 FISH OIL PO) Take 1 capsule by mouth daily.    [provider]    Allergies: Patient has no known allergies.    Review of Systems  Respiratory:  Positive for shortness of breath.   All other systems reviewed and are negative.   Updated Vital Signs BP 122/86 (BP Location: Right Arm)   Pulse 86    Temp 98.5 F (36.9 C)   Resp (!) 22   LMP 12/10/2024 (Exact Date)   SpO2 100%   Physical Exam Vitals and nursing note reviewed.  Constitutional:      General: She is not in acute distress.    Appearance: She is well-developed.  HENT:     Head: Normocephalic and atraumatic.  Eyes:     Conjunctiva/sclera: Conjunctivae normal.  Cardiovascular:     Rate and Rhythm: Normal rate and regular rhythm.     Heart sounds: No murmur heard. Pulmonary:     Effort: Pulmonary effort is normal. No respiratory distress.     Breath sounds: Normal breath sounds. No decreased breath sounds, wheezing, rhonchi or rales.  Chest:     Chest wall: No mass, tenderness or edema.  Abdominal:     Palpations: Abdomen is soft.     Tenderness: There is no abdominal tenderness.  Musculoskeletal:        General: No swelling.     Cervical back: Neck supple.     Right lower leg: No edema.     Left lower leg: No edema.  Skin:    General: Skin is warm and dry.     Capillary Refill: Capillary refill takes less than 2 seconds.  Neurological:     Mental Status: She is alert.  Psychiatric:  Mood and Affect: Mood normal.     (all labs ordered are listed, but only abnormal results are displayed) Labs Reviewed  RESP PANEL BY RT-PCR (RSV, FLU A&B, COVID)  [id number]    EKG: None  Radiology: DG Chest 2 View Result Date: 12/22/2024 CLINICAL DATA:  Cough and shortness of breath EXAM: CHEST - 2 VIEW COMPARISON:  08/02/2022 FINDINGS: The heart size and mediastinal contours are within normal limits. Similar elevation and blunting of the right costophrenic angle with volume loss the right hemithorax as before. Remote similar metallic foreign bodies over the right lower chest and liver. Embolization metallic coils in the hepatic vasculature better demonstrated by comparison CT. No superimposed acute airspace process, collapse or consolidation. No enlarging effusion or pneumothorax. Trachea midline. No acute osseous  finding. Overall stable exam. IMPRESSION: Stable chest exam. No interval change or acute process by plain radiography. Electronically Signed   By: CHRISTELLA.  Shick M.D.   On: 12/22/2024 14:26     Procedures   Medications Ordered in the ED - No data to display                                  Medical Decision Making Amount and/or Complexity of Data Reviewed Radiology: ordered.  Risk Prescription drug management.   This patient presents to the ED for concern of shortness of breath.  Differential diagnosis includes COVID-19, viral URI, pneumonia, bronchitis    Additional history obtained:  Additional history obtained from chart review   Lab Tests:  I Ordered, and personally interpreted labs.  The pertinent results include: Respiratory panel negative   Imaging Studies ordered:  I ordered imaging studies including chest x-ray I independently visualized and interpreted imaging which showed unremarkable I agree with the radiologist interpretation   Problem List / ED Course:  Patient presented to the emergency department with concerns of cough and shortness of breath.  Reports 6 days of symptom onset with some associated body aches, fever, and fatigue.  Reports her son is sick with similar symptoms although she was initially 1 second at home.  Denies any other sick contacts as far she is aware.  Denies any cardiac or pulmonary history. Physical exam is reassuring abnormal heart or lung sounds.  There is no lower extremity swelling or edema. Respiratory panel is negative.  Chest x-ray also obtained which was negative for any acute findings. With her son recently testing positive for influenza A, I suspect patient has ongoing symptoms from influenza as she is about 6 days since symptom onset. With her shortness of breath appearing to sound more like congestion, advised her to take Mucinex for congestion as well as Promethazine -DM for cough. Return precautions advised and she verbalized  understanding and agreement. Otherwise stable for outpatient follow up and discharged home.    Social Determinants of Health:  None  Final diagnoses:  Influenza A    ED Discharge Orders          Ordered    promethazine -dextromethorphan (PROMETHAZINE -DM) 6.25-15 MG/5ML syrup  4 times daily PRN        12/22/24 1540               Aariya Ferrick A, PA-C 12/23/24 1200    Kammerer, Megan L, DO 12/25/24 9288  "
